# Patient Record
Sex: Female | Born: 1990 | Race: Black or African American | Hispanic: No | Marital: Single | Smoking: Former smoker
Health system: Southern US, Community
[De-identification: ages and names within clinical notes are randomized; demographics above are authoritative.]

## PROBLEM LIST (undated history)

## (undated) DIAGNOSIS — A599 Trichomoniasis, unspecified: Secondary | ICD-10-CM

## (undated) DIAGNOSIS — Z5189 Encounter for other specified aftercare: Secondary | ICD-10-CM

## (undated) DIAGNOSIS — T7840XA Allergy, unspecified, initial encounter: Secondary | ICD-10-CM

## (undated) DIAGNOSIS — D649 Anemia, unspecified: Secondary | ICD-10-CM

## (undated) HISTORY — DX: Encounter for other specified aftercare: Z51.89

## (undated) HISTORY — DX: Anemia, unspecified: D64.9

## (undated) HISTORY — DX: Trichomoniasis, unspecified: A59.9

## (undated) HISTORY — PX: TONSILLECTOMY: SUR1361

## (undated) HISTORY — PX: ECTOPIC PREGNANCY SURGERY: SHX613

## (undated) HISTORY — DX: Allergy, unspecified, initial encounter: T78.40XA

---

## 2012-04-30 ENCOUNTER — Encounter: Payer: Self-pay | Admitting: Physician Assistant

## 2012-04-30 ENCOUNTER — Ambulatory Visit (INDEPENDENT_AMBULATORY_CARE_PROVIDER_SITE_OTHER): Payer: BC Managed Care – PPO | Admitting: Physician Assistant

## 2012-04-30 VITALS — BP 142/63 | HR 60 | Temp 98.3°F | Resp 16 | Ht 64.5 in | Wt 215.0 lb

## 2012-04-30 DIAGNOSIS — M79609 Pain in unspecified limb: Secondary | ICD-10-CM

## 2012-04-30 DIAGNOSIS — M79644 Pain in right finger(s): Secondary | ICD-10-CM

## 2012-04-30 NOTE — Progress Notes (Signed)
  Subjective:    Patient ID: Madison Charles, female    DOB: 05/07/1991, 21 y.o.   MRN: [id number]  HPI Pt presents to clinic with a ring stuck on her R 4th digit.  She has tried everything she can think of and she cannot get it off.     Review of Systems     Objective:   Physical Exam  Vitals reviewed. Constitutional: She is oriented to person, place, and time. She appears well-developed and well-nourished.  HENT:  Head: Normocephalic and atraumatic.  Right Ear: External ear normal.  Left Ear: External ear normal.  Pulmonary/Chest: Effort normal.  Musculoskeletal:       Hands: Neurological: She is alert and oriented to person, place, and time.  Skin: Skin is warm and dry.  Psychiatric: She has a normal mood and affect. Her behavior is normal. Judgment and thought content normal.      Assessment & Plan:   1. Finger pain, right    Ring removed and patient states pain resolved.

## 2012-05-09 ENCOUNTER — Ambulatory Visit (INDEPENDENT_AMBULATORY_CARE_PROVIDER_SITE_OTHER): Payer: BC Managed Care – PPO | Admitting: Family Medicine

## 2012-05-09 ENCOUNTER — Ambulatory Visit: Payer: BC Managed Care – PPO

## 2012-05-09 VITALS — BP 124/81 | HR 61 | Temp 98.0°F | Resp 16 | Ht 64.0 in | Wt 211.0 lb

## 2012-05-09 DIAGNOSIS — M79609 Pain in unspecified limb: Secondary | ICD-10-CM

## 2012-05-09 DIAGNOSIS — M79673 Pain in unspecified foot: Secondary | ICD-10-CM

## 2012-05-09 DIAGNOSIS — M7740 Metatarsalgia, unspecified foot: Secondary | ICD-10-CM

## 2012-05-09 NOTE — Progress Notes (Signed)
  Subjective:    Patient ID: Madison Charles, female    DOB: 15-Oct-1990, 21 y.o.   MRN: [id number]  HPI R great toe pain x 2 days.  Pt works on her feet all day.  Has noticed pain on plantar aspect of R 1st metarsal.  Pain is worse with prolonged standing.  No numbness or paresthesias.     Review of Systems     Objective:   Physical Exam Gen: in bed, NAD HEENT: NCAT, EOMI CV: RRR PULM: normal breath sounds  MSK:  + TTP on plantar aspect of 1st metatarsal.  Full ROM and strength.  Neurovascularly intact distally.   [facility] reading (PRIMARY) by  Dr. Alvester Morin . R foot xray preliminarily negative for any fractures.        Assessment & Plan:  Metatarsalgia:  Will place metatarsal pad and post op shoe  RICE and NSAIDs.  Follow up in 1-2 weeks if sxs not improved.

## 2012-08-11 ENCOUNTER — Ambulatory Visit (INDEPENDENT_AMBULATORY_CARE_PROVIDER_SITE_OTHER): Payer: BC Managed Care – PPO | Admitting: Physician Assistant

## 2012-08-11 VITALS — BP 122/78 | HR 80 | Temp 98.6°F | Resp 12 | Ht 63.3 in | Wt 210.0 lb

## 2012-08-11 DIAGNOSIS — R509 Fever, unspecified: Secondary | ICD-10-CM

## 2012-08-11 DIAGNOSIS — R52 Pain, unspecified: Secondary | ICD-10-CM

## 2012-08-11 DIAGNOSIS — J029 Acute pharyngitis, unspecified: Secondary | ICD-10-CM

## 2012-08-11 MED ORDER — PSEUDOEPHEDRINE HCL ER 120 MG PO TB12: 120.0000 mg | ORAL_TABLET | Freq: Two times a day (BID) | ORAL | Status: DC

## 2012-08-11 MED ORDER — OSELTAMIVIR PHOSPHATE 75 MG PO CAPS: 75.0000 mg | ORAL_CAPSULE | Freq: Two times a day (BID) | ORAL | Status: DC

## 2012-08-11 NOTE — Progress Notes (Signed)
  Subjective:    Patient ID: Madison Charles, female    DOB: Dec 09, 1990, 21 y.o.   MRN: [id number]  HPI  21 yr old AAF presents with acute and sudden onset of illness about 36 hours ago.  She describes severe chills with having to put on multiple layers of clothing on and still couldn't get warm. She has a cough, sore throat, severe nasal congestion. R ear pain, both ears congested. Her body is aching all over. She did not have a flu shot.  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  Review of Systems  All other systems reviewed and are negative.       Objective:   Physical Exam  Nursing note and vitals reviewed. Constitutional: She is oriented to person, place, and time. She appears well-developed and well-nourished.       Appears ill, but not toxic  HENT:  Head: Normocephalic and atraumatic.  Right Ear: External ear normal.  Left Ear: External ear normal.  Mouth/Throat: Oropharynx is clear and moist. No oropharyngeal exudate (throat with mild erythema and PND).       B TM with congestion but no infection.  Nose with clear rhinorrhea  Neck: Normal range of motion. Neck supple.  Cardiovascular: Normal rate, regular rhythm and normal heart sounds.   Pulmonary/Chest: Effort normal and breath sounds normal.  Neurological: She is alert and oriented to person, place, and time.  Skin: Skin is warm and dry.  Psychiatric: She has a normal mood and affect. Her behavior is normal.          Assessment & Plan:  Clinically suspicious of flu-start tamiflu, fluids, rest, OTCs as needed for symptoms.

## 2012-10-25 ENCOUNTER — Ambulatory Visit: Payer: BC Managed Care – PPO | Admitting: Family Medicine

## 2012-11-02 ENCOUNTER — Ambulatory Visit (INDEPENDENT_AMBULATORY_CARE_PROVIDER_SITE_OTHER): Payer: BC Managed Care – PPO | Admitting: Physician Assistant

## 2012-11-02 VITALS — BP 136/84 | HR 62 | Temp 98.2°F | Resp 18 | Ht 64.5 in | Wt 211.0 lb

## 2012-11-02 DIAGNOSIS — N390 Urinary tract infection, site not specified: Secondary | ICD-10-CM

## 2012-11-02 DIAGNOSIS — R350 Frequency of micturition: Secondary | ICD-10-CM

## 2012-11-02 LAB — POCT URINALYSIS DIPSTICK
Bilirubin, UA: NEGATIVE
Glucose, UA: NEGATIVE
Ketones, UA: NEGATIVE
Leukocytes, UA: NEGATIVE
Nitrite, UA: NEGATIVE
Protein, UA: 30
Spec Grav, UA: 1.025
Urobilinogen, UA: 0.2
pH, UA: 6

## 2012-11-02 LAB — POCT UA - MICROSCOPIC ONLY
Casts, Ur, LPF, POC: NEGATIVE
Crystals, Ur, HPF, POC: NEGATIVE
Mucus, UA: POSITIVE
Yeast, UA: NEGATIVE

## 2012-11-02 MED ORDER — SULFAMETHOXAZOLE-TRIMETHOPRIM 800-160 MG PO TABS: 1.0000 | ORAL_TABLET | Freq: Two times a day (BID) | ORAL | Status: DC

## 2012-11-02 NOTE — Progress Notes (Signed)
I have examined this patient along with the student and agree. She will establish with primary care for routine care and screening. Fernande Bras, PA-C Certified Physician Assistant [facility]

## 2012-11-02 NOTE — Progress Notes (Signed)
Subjective:    Patient ID: Madison Charles, female    DOB: 01/22/1991, 22 y.o.   MRN: [id number]  HPI  A 22 year old female presents with urinary frequency since 10/26/12.  Pt complains of urinary urgency and frequency since 10/26/12.  Admits to hematuria but states she is currently on her menstrual cycle.  Also admits to LBP and some abdominal pain.  Denies vaginal odor, pruritis, dysuria, fever.  Pt has tendency to hold her urine for long periods of time.        Pt is in a monogamous, heterosexual relationship.  She last had vaginal intercourse on 10/26/12 and her Nuvaring fell out spontaneously on 10/27/12.  Has hx of trichomonas 2 years ago that was tx and resolved.  Pt does not engage in oral intercourse.  Last papsmear was about 2-3 weeks ago and was normal.  She also had STD testing at this time which was negative.  Pt admits to polydipsia since 10/26/12.  She has a significant family history of diabetes and has not had a CPE in 3 years.   Past Medical History  Diagnosis Date  . Trichomonas 2 yrs ago    tx and resolved    Past Surgical History  Procedure Laterality Date  . Tonsillectomy Bilateral around 8 or 22 y.o.    Prior to Admission medications   Medication Sig Start Date End Date Taking? Authorizing [name]  etonogestrel-ethinyl estradiol (NUVARING) 0.12-0.015 MG/24HR vaginal ring Place 1 each vaginally every 28 (twenty-eight) days. Insert vaginally and leave in place for 3 consecutive weeks, then remove for 1 week.   Yes Historical [name], MD    No Known Allergies  History   Social History  . Marital Status: Single    Spouse Name: N/A    Number of Children: N/A  . Years of Education: N/A   Occupational History  . Forensic psychologist and [employer]  . cashier Other    [employer]   Social History Main Topics  . Smoking status: Never Smoker   . Smokeless tobacco: Not on file  . Alcohol Use: No  . Drug Use: No  . Sexually Active: Yes   Birth Control/ Protection: Condom, Other-see comments     Comment: Nuvaring   Other Topics Concern  . Not on file   Social History Narrative  . No narrative on file    Family History  Problem Relation Age of Onset  . Diabetes Father   . Heart disease Father   . Diabetes Sister   . Cancer Maternal Grandmother     colon  . Heart disease Maternal Grandfather     MI  . Cancer Maternal Aunt     thyroid    Review of Systems   As above.  Objective:   Physical Exam  BP 136/84  Pulse 62  Temp(Src) 98.2 F (36.8 C) (Oral)  Resp 18  Ht 5' 4.5" (1.638 m)  Wt 211 lb (95.709 kg)  BMI 35.67 kg/m2  SpO2 99%  LMP 10/29/2012  General:  Pleasant, obese female.  NAD. Neck:  Supple.  Positive acanthosis nigricans.  No lymphadenopathy. Abdomen:  Positive striae.  Obese, soft.  Suprapubic tenderness.  Normal bowel sounds.  Negative for CVA tenderness. Heart:  RRR.  Normal S1,S2.  No m/g/r. Lungs:  CTAB.    Assessment & Plan:  Urinary frequency - Plan: POCT UA - Microscopic Only, POCT urinalysis dipstick, Urine culture, sulfamethoxazole-trimethoprim (BACTRIM DS,SEPTRA DS) 800-160 MG per  tablet  UTI (urinary tract infection)  Patient Instructions  Please schedule an appointment with a primary care [name], here or elsewhere, for routine health maintenance.  Complete antibiotic until no pills left.Use condoms during every sexual encounter.  We will call you once your urine culture results come back.  If you finish your antibiotic and you are still having symptoms return to clinic for re-evaluation.

## 2012-11-02 NOTE — Patient Instructions (Addendum)
Please schedule an appointment with a primary care provider, here or elsewhere, for routine health maintenance.  Complete antibiotic until no pills left.Use condoms during every sexual encounter.  We will call you once your urine culture results come back.  If you finish your antibiotic and you are still having symptoms return to clinic for re-evaluation.

## 2012-11-04 LAB — URINE CULTURE

## 2012-11-05 ENCOUNTER — Encounter: Payer: Self-pay | Admitting: Physician Assistant

## 2012-11-16 ENCOUNTER — Ambulatory Visit (INDEPENDENT_AMBULATORY_CARE_PROVIDER_SITE_OTHER): Payer: BC Managed Care – PPO | Admitting: Family Medicine

## 2012-11-16 ENCOUNTER — Encounter: Payer: Self-pay | Admitting: Family Medicine

## 2012-11-16 VITALS — BP 116/70 | HR 53 | Temp 97.9°F | Resp 16 | Ht 64.0 in | Wt 209.0 lb

## 2012-11-16 DIAGNOSIS — Z0000 Encounter for general adult medical examination without abnormal findings: Secondary | ICD-10-CM

## 2012-11-16 DIAGNOSIS — J309 Allergic rhinitis, unspecified: Secondary | ICD-10-CM

## 2012-11-16 DIAGNOSIS — Z789 Other specified health status: Secondary | ICD-10-CM | POA: Insufficient documentation

## 2012-11-16 DIAGNOSIS — K3189 Other diseases of stomach and duodenum: Secondary | ICD-10-CM

## 2012-11-16 DIAGNOSIS — R1013 Epigastric pain: Secondary | ICD-10-CM | POA: Insufficient documentation

## 2012-11-16 LAB — POCT URINALYSIS DIPSTICK
Glucose, UA: NEGATIVE
Nitrite, UA: NEGATIVE
Urobilinogen, UA: 0.2

## 2012-11-16 MED ORDER — FLUTICASONE PROPIONATE 50 MCG/ACT NA SUSP: 2.0000 | Freq: Every day | NASAL | Status: DC

## 2012-11-16 MED ORDER — CETIRIZINE HCL 10 MG PO TABS: ORAL_TABLET | ORAL | Status: DC

## 2012-11-16 NOTE — Patient Instructions (Addendum)
Keeping You Healthy  Get These Tests 1. Blood Pressure- Have your blood pressure checked once a year by your health care provider.  Normal blood pressure is 120/80. 2. Weight- Have your body mass index (BMI) calculated to screen for obesity.  BMI is measure of body fat based on height and weight.  You can also calculate your own BMI at https://www.west-esparza.com/. 3. Cholesterol- Have your cholesterol checked every 5 years starting at age 20 then yearly starting at age 77. 4. Chlamydia, HIV, and other sexually transmitted diseases- Get screened every year until age 67, then within three months of each new sexual provider. 5. Pap Smear- Every 1-3 years; discuss with your health care provider. 6. Mammogram- Every year starting at age 20  Take these medicines  Calcium with Vitamin D-Your body needs 1200 mg of Calcium each day and [phone] IU of Vitamin D daily.  Your body can only absorb 500 mg of Calcium at a time so Calcium must be taken in 2 or 3 divided doses throughout the day.  Multivitamin with folic acid- Once daily if it is possible for you to become pregnant.  Get these Immunizations  Gardasil-Series of three doses; prevents HPV related illness such as genital warts and cervical cancer.  Menactra-Single dose; prevents meningitis.  Tetanus shot- Every 10 years.  Flu shot-Every year.  Take these steps 1. Do not smoke-Your healthcare provider can help you quit.  For tips on how to quit go to www.smokefree.gov or call 1-800 QUITNOW. 2. Be physically active- Exercise 5 days a week for at least 30 minutes.  If you are not already physically active, start slow and gradually work up to 30 minutes of moderate physical activity.  Examples of moderate activity include walking briskly, dancing, swimming, bicycling, etc. 3. Breast Cancer- A self breast exam every month is important for early detection of breast cancer.  For more information and instruction on self breast exams, ask your  healthcare provider or [place].[employer]. 4. Eat a healthy diet- Eat a variety of healthy foods such as fruits, vegetables, whole grains, low fat milk, low fat cheeses, yogurt, lean meats, poultry and fish, beans, nuts, tofu, etc.  For more information go to www. Thenutritionsource.org 5. Drink alcohol in moderation- Limit alcohol intake to one drink or less per day. Never drink and drive. 6. Depression- Your emotional health is as important as your physical health.  If you're feeling down or losing interest in things you normally enjoy please talk to your healthcare provider about being screened for depression. 7. Dental visit- Brush and floss your teeth twice daily; visit your dentist twice a year. 8. Eye doctor- Get an eye exam at least every 2 years. 9. Helmet use- Always wear a helmet when riding a bicycle, motorcycle, rollerblading or skateboarding. 10. Safe sex- If you may be exposed to sexually transmitted infections, use a condom. 11. Seat belts- Seat belts can save your live; always wear one. 12. Smoke/Carbon Monoxide detectors- These detectors need to be installed on the appropriate level of your home. Replace batteries at least once a year. 13. Skin cancer- When out in the sun please cover up and use sunscreen 15 SPF or higher. 14. Violence- If anyone is threatening or hurting you, please tell your healthcare provider.     Allergic Rhinitis Allergic rhinitis is when the mucous membranes in the nose respond to allergens. Allergens are particles in the air that cause your body to have an allergic reaction. This causes you to release allergic  antibodies. Through a chain of events, these eventually cause you to release histamine into the blood stream (hence the use of antihistamines). Although meant to be protective to the body, it is this release that causes your discomfort, such as frequent sneezing, congestion and an itchy runny nose.  CAUSES  The pollen  allergens may come from grasses, trees, and weeds. This is seasonal allergic rhinitis, or "hay fever." Other allergens cause year-round allergic rhinitis (perennial allergic rhinitis) such as house dust mite allergen, pet dander and mold spores.  SYMPTOMS   Nasal stuffiness (congestion).  Runny, itchy nose with sneezing and tearing of the eyes.  There is often an itching of the mouth, eyes and ears. It cannot be cured, but it can be controlled with medications. DIAGNOSIS  If you are unable to determine the offending allergen, skin or blood testing may find it. TREATMENT   Avoid the allergen.  Medications and allergy shots (immunotherapy) can help.  Hay fever may often be treated with antihistamines in pill or nasal spray forms. Antihistamines block the effects of histamine. There are over-the-counter medicines that may help with nasal congestion and swelling around the eyes. Check with your caregiver before taking or giving this medicine. If the treatment above does not work, there are many new medications your caregiver can prescribe. Stronger medications may be used if initial measures are ineffective. Desensitizing injections can be used if medications and avoidance fails. Desensitization is when a patient is given ongoing shots until the body becomes less sensitive to the allergen. Make sure you follow up with your caregiver if problems continue. SEEK MEDICAL CARE IF:   You develop fever (more than 100.5 F (38.1 C).  You develop a cough that does not stop easily (persistent).  You have shortness of breath.  You start wheezing.  Symptoms interfere with normal daily activities. Document Released: 05/03/2001 Document Revised: 10/31/2011 Document Reviewed: 11/12/2008 [facility] Patient Information 2013 [place], [place].     Indigestion Indigestion is discomfort in the upper abdomen that is caused by underlying problems such as gastroesophageal reflux disease (GERD), ulcers, or  gallbladder problems.  CAUSES  Indigestion can be caused by many things. Possible causes include:  Stomach acid in the esophagus.  Stomach infections, usually caused by the bacteria H. pylori.  Being overweight.  Hiatal hernia. This means part of the stomach pushes up through the diaphragm.  Overeating.  Emotional problems, such as stress, anxiety, or depression.  Poor nutrition.  Consuming too much alcohol, tobacco, or caffeine.  Consuming spicy foods, fats, peppermint, chocolate, tomato products, citrus, or fruit juices.  Medicines such as aspirin and other anti-inflammatory drugs, hormones, steroids, and thyroid medicines.  Gastroparesis. This is a condition in which the stomach does not empty properly.  Stomach cancer.  Pregnancy, due to an increase in hormone levels, a relaxation of muscles in the digestive tract, and pressure on the stomach from the growing fetus. SYMPTOMS   Uncomfortable feeling of fullness after eating.  Pain or burning sensation in the upper abdomen.  Bloating.  Belching and gas.  Nausea and vomiting.  Acidic taste in the mouth.  Burning sensation in the chest (heartburn). DIAGNOSIS  Your caregiver will review your medical history and perform a physical exam. Other tests, such as blood tests, stool tests, X-rays, and other imaging scans, may be done to check for more serious problems. TREATMENT  Liquid antacids and other drugs may be given to block stomach acid secretion. Medicines that increase esophageal muscle tone may also be  given to help reduce symptoms. If an infection is found, antibiotic medicine may be given. HOME CARE INSTRUCTIONS  Avoid foods and drinks that make your symptoms worse, such as:  Caffeine or alcoholic drinks.  Chocolate.  Peppermint or mint flavorings.  Garlic and onions.  Spicy foods.  Citrus fruits, such as oranges, lemons, or limes.  Tomato-based foods such as sauce, chili, salsa, and  pizza.  Fried and fatty foods.  Avoid eating for the 3 hours prior to your bedtime.  Eat small, frequent meals instead of large meals.  Stop smoking if you smoke.  Maintain a healthy weight.  Wear loose-fitting clothing. Do not wear anything tight around your waist that causes pressure on your stomach.  Raise the head of your bed 4 to 8 inches with wood blocks to help you sleep. Extra pillows will not help.  Only take over-the-counter or prescription medicines as directed by your caregiver.  Do not take aspirin, ibuprofen, or other nonsteroidal anti-inflammatory drugs (NSAIDs). SEEK IMMEDIATE MEDICAL CARE IF:   You are not better after 2 days.  You have chest pressure or pain that radiates up into your neck, arms, back, jaw, or upper abdomen.  You have difficulty swallowing.  You keep vomiting.  You have black or bloody stools.  You have a fever.  You have dizziness, fainting, difficulty breathing, or heavy sweating.  You have severe abdominal pain.  You lose weight without trying. MAKE SURE YOU:  Understand these instructions.  Will watch your condition.  Will get help right away if you are not doing well or get worse. Document Released: 09/15/2004 Document Revised: 10/31/2011 Document Reviewed: 03/23/2011 [facility] Patient Information 2013 [place], [place].

## 2012-11-16 NOTE — Progress Notes (Signed)
Subjective:    Patient ID: Madison Charles, female    DOB: April 09, 1991, 22 y.o.   MRN: [id number]  HPI  This 22 y.o. AA female is here for CPE; GYN care is at [facility] (Dr. Ernestina Penna).  Pt has results of Feb 2014 exam (to be scanned into EMR). HIV, RPR and Hep C Ab screening   (negative). Hgb A1c= 4.9%. Pt is working on weight reduction and healthier lifestyle.   Pt has allergies since childhood but is currently not taking medication. In the past, she has used   oral medication as well as nasal spray;  OTC meds have not been tried. She tries to control  dust in her home environment.   Pt reports occasional LUQ discomfort and bloating associated w/ spicy foods/ pasta. She denies  other food intolerances, n/v or change in bowel habits. Treatment for UTI at 89 earlier this month   is complete; she is asymptomatic.     Review of Systems  Constitutional: Negative.   HENT: Positive for congestion and sneezing.   Eyes: Negative.   Respiratory: Negative.   Cardiovascular: Negative.   Gastrointestinal: Negative.   Endocrine: Negative.   Genitourinary: Positive for urgency and frequency. Negative for dysuria and hematuria.  Musculoskeletal: Negative.   Skin: Negative.   Allergic/Immunologic: Negative.   Neurological: Negative.   Hematological: Negative.   Psychiatric/Behavioral: Negative.        Objective:   Physical Exam  Nursing note and vitals reviewed. Constitutional: She is oriented to person, place, and time. Vital signs are normal. She appears well-developed and well-nourished. No distress.  HENT:  Head: Normocephalic and atraumatic.  Right Ear: Hearing, tympanic membrane, external ear and ear canal normal.  Left Ear: Hearing, tympanic membrane, external ear and ear canal normal.  Nose: Mucosal edema present. No rhinorrhea, sinus tenderness, nasal deformity or septal deviation. Right sinus exhibits no maxillary sinus tenderness and no frontal sinus tenderness. Left  sinus exhibits no maxillary sinus tenderness and no frontal sinus tenderness.  Mouth/Throat: Uvula is midline and mucous membranes are normal. No oral lesions. Normal dentition. No dental caries. Posterior oropharyngeal erythema present. No oropharyngeal exudate or posterior oropharyngeal edema.  Nasal crease noted (due to "allergic salute")  Eyes: EOM and lids are normal. Pupils are equal, round, and reactive to light. Right eye exhibits no discharge. Left eye exhibits no discharge. Right conjunctiva is injected. Left conjunctiva is injected. No scleral icterus.  Neck: Normal range of motion. Neck supple. No thyromegaly present.  Cardiovascular: Normal rate, regular rhythm, normal heart sounds and intact distal pulses.  Exam reveals no gallop and no friction rub.   No murmur heard. Pulmonary/Chest: Effort normal and breath sounds normal. No respiratory distress. She has no wheezes.  Abdominal: Soft. She exhibits no distension and no mass. Bowel sounds are decreased. There is no hepatosplenomegaly. There is no tenderness. There is no guarding and no CVA tenderness.  Musculoskeletal: Normal range of motion. She exhibits no edema and no tenderness.  Lymphadenopathy:    She has no cervical adenopathy.  Neurological: She is alert and oriented to person, place, and time. She has normal reflexes. No cranial nerve deficit. She exhibits normal muscle tone. Coordination normal.  Skin: Skin is warm and dry. No rash noted. No erythema.  Psychiatric: She has a normal mood and affect. Her behavior is normal. Judgment and thought content normal.    Results for orders placed in visit on 11/16/12  POCT URINALYSIS DIPSTICK      Result  Value Range   Color, UA yellow     Clarity, UA clear     Glucose, UA neg     Bilirubin, UA neg     Ketones, UA trace     Spec Grav, UA 1.015     Blood, UA neg     pH, UA 6.5     Protein, UA neg     Urobilinogen, UA 0.2     Nitrite, UA neg     Leukocytes, UA small (1+)           Assessment & Plan:  Routine general medical examination at a health care facility - Plan: POCT urinalysis dipstick  Allergic rhinitis- oral antihistamine and steroid nasal spray prescribed. Use air purifier in bedroom.  Dyspepsia- advised food avoidances and other recommendations given.  Meds ordered this encounter  Medications  . cetirizine (ZYRTEC) 10 MG tablet    Sig: Take 1 tablet by mouth every evening for allergies.    Dispense:  30 tablet    Refill:  5  . fluticasone (FLONASE) 50 MCG/ACT nasal spray    Sig: Place 2 sprays into the nose daily. Use at bedtime.    Dispense:  16 g    Refill:  6

## 2013-06-06 ENCOUNTER — Ambulatory Visit (INDEPENDENT_AMBULATORY_CARE_PROVIDER_SITE_OTHER): Payer: BC Managed Care – PPO | Admitting: Family Medicine

## 2013-06-06 ENCOUNTER — Ambulatory Visit: Payer: BC Managed Care – PPO

## 2013-06-06 VITALS — BP 128/54 | HR 64 | Temp 98.5°F | Resp 18 | Ht 63.75 in | Wt 210.2 lb

## 2013-06-06 DIAGNOSIS — J302 Other seasonal allergic rhinitis: Secondary | ICD-10-CM

## 2013-06-06 DIAGNOSIS — J45990 Exercise induced bronchospasm: Secondary | ICD-10-CM

## 2013-06-06 DIAGNOSIS — R0602 Shortness of breath: Secondary | ICD-10-CM

## 2013-06-06 DIAGNOSIS — J309 Allergic rhinitis, unspecified: Secondary | ICD-10-CM

## 2013-06-06 LAB — POCT CBC
Granulocyte percent: 36.8 %G — AB (ref 37–80)
HCT, POC: 37.4 % — AB (ref 37.7–47.9)
Hemoglobin: 11.3 g/dL — AB (ref 12.2–16.2)
Lymph, poc: 2.5 (ref 0.6–3.4)
MCH, POC: 26.6 pg — AB (ref 27–31.2)
MCHC: 30.2 g/dL — AB (ref 31.8–35.4)
MCV: 87.9 fL (ref 80–97)
MID (cbc): 0.4 (ref 0–0.9)
MPV: 8.6 fL (ref 0–99.8)
POC Granulocyte: 1.7 — AB (ref 2–6.9)
POC LYMPH PERCENT: 55.3 %L — AB (ref 10–50)
POC MID %: 7.9 % (ref 0–12)
Platelet Count, POC: 205 10*3/uL (ref 142–424)
RBC: 4.25 M/uL (ref 4.04–5.48)
RDW, POC: 13.7 %
WBC: 4.5 10*3/uL — AB (ref 4.6–10.2)

## 2013-06-06 MED ORDER — ALBUTEROL SULFATE HFA 108 (90 BASE) MCG/ACT IN AERS: 2.0000 | INHALATION_SPRAY | Freq: Four times a day (QID) | RESPIRATORY_TRACT | Status: DC | PRN

## 2013-06-06 NOTE — Progress Notes (Signed)
[facility]:  Office Visit  Chief Complaint:  Chief Complaint  Patient presents with  . Shortness of Breath    pt.around smokers, becomes hard to breath for over a year, worsening    HPI: Madison Charles is a 22 y.o. female who is here for SOb and wheezing around smokers, x 1 year.  She never had asthma as a child, No hospitalizations in the past for RAD or asthma sxs Has allergies, seasonal allergies, Was taking zyrtec but stopped taking it, helped some but di dnot continue because ran out No prior history of inhaler Has chest tightening when Around smokers, when it gets hot, when she is active, she climbs a lot of stairs at work and that makes her SOB  When she runs she is SOB but she does not exercise regular, no major weight gain per patient in the last 5 years Nonsmoker, denies eczema Denis CP, palpitations, [place] Cordts edema   Past Medical History  Diagnosis Date  . Trichomonas 2 yrs ago    tx and resolved  . Allergy    Past Surgical History  Procedure Laterality Date  . Tonsillectomy Bilateral around 8 or 22 y.o.   History   Social History  . Marital Status: Single    Spouse Name: N/A    Number of Children: N/A  . Years of Education: N/A   Occupational History  . Forensic psychologist and [employer]  . cashier Other    [employer]   Social History Main Topics  . Smoking status: Never Smoker   . Smokeless tobacco: None  . Alcohol Use: No  . Drug Use: No  . Sexual Activity: Yes    Birth Control/ Protection: Condom, Other-see comments     Comment: Nuvaring   Other Topics Concern  . None   Social History Narrative  . None   Family History  Problem Relation Age of Onset  . Diabetes Father   . Heart disease Father   . Diabetes Sister   . Cancer Maternal Grandmother     colon  . Heart disease Maternal Grandfather     MI  . Cancer Maternal Aunt     thyroid   No Known Allergies Prior to Admission medications   Not  on File     ROS: The patient denies fevers, chills, night sweats, unintentional weight loss, chest pain, palpitations, wheezing, dyspnea on exertion, nausea, vomiting, abdominal pain, dysuria, hematuria, melena, numbness, weakness, or tingling.   All other systems have been reviewed and were otherwise negative with the exception of those mentioned in the HPI and as above.    PHYSICAL EXAM: Filed Vitals:   06/06/13 1956  BP: 128/54  Pulse: 64  Temp: 98.5 F (36.9 C)  Resp: 18  SPO2  99% Filed Vitals:   06/06/13 1956  Height: 5' 3.75" (1.619 m)  Weight: 210 lb 3.2 oz (95.346 kg)   Body mass index is 36.38 kg/(m^2).  General: Alert, no acute distress, obese AA female, pleasant  HEENT:  Normocephalic, atraumatic, oropharynx patent. EOMI, PERRLA Cardiovascular:  Regular rate and rhythm, no rubs murmurs or gallops.  No Carotid bruits, radial pulse intact. No pedal edema.  Respiratory: Clear to auscultation bilaterally.  No wheezes, rales, or rhonchi.  No cyanosis, no use of accessory musculature GI: No organomegaly, abdomen is soft and non-tender, positive bowel sounds.  No masses. Skin: No rashes. Neurologic: Facial musculature symmetric. Psychiatric: Patient is appropriate throughout our interaction.  Lymphatic: No cervical lymphadenopathy Musculoskeletal: Gait intact.   LABS: Results for orders placed in visit on 06/06/13  POCT CBC      Result Value Range   WBC 4.5 (*) 4.6 - 10.2 K/uL   Lymph, poc 2.5  0.6 - 3.4   POC LYMPH PERCENT 55.3 (*) 10 - 50 %L   MID (cbc) 0.4  0 - 0.9   POC MID % 7.9  0 - 12 %M   POC Granulocyte 1.7 (*) 2 - 6.9   Granulocyte percent 36.8 (*) 37 - 80 %G   RBC 4.25  4.04 - 5.48 M/uL   Hemoglobin 11.3 (*) 12.2 - 16.2 g/dL   HCT, POC 16.1 (*) 09.6 - 47.9 %   MCV 87.9  80 - 97 fL   MCH, POC 26.6 (*) 27 - 31.2 pg   MCHC 30.2 (*) 31.8 - 35.4 g/dL   RDW, POC 04.5     Platelet Count, POC 205  142 - 424 K/uL   MPV 8.6  0 - 99.8 fL     EKG/XRAY:    Primary read interpreted by Dr. Conley Rolls at [facility]. No acute cardiopulmonary process   ASSESSMENT/PLAN: Encounter Diagnoses  Name Primary?  . SOB (shortness of breath) Yes  . Exercise induced bronchospasm   . Seasonal allergies    Spirometry normal  Anemia due to most likely heavy periods, she is asymptomatic Not on Nuva ring, not on birth control low risk for PE. Uses condoms Rx albuterol prn, advise if have to use it more than 2 times daily or more than 2 times a week then need to return to office Continue with antihistamine otc  If she is not feeling better then consider adding singulair but need to do a trial of at least 1 month consistently Gross sideeffects, risk and benefits, and alternatives of medications d/w patient. Patient is aware that all medications have potential sideeffects and we are unable to predict every sideeffect or drug-drug interaction that may occur.  Shaquanda Graves PHUONG, DO 06/10/2013 9:41 AM

## 2013-06-06 NOTE — Patient Instructions (Addendum)
Allergies, Generic  Allergies may happen from anything your body is sensitive to. This may be food, medicines, pollens, chemicals, and nearly anything around you in everyday life that produces allergens. An allergen is anything that causes an allergy producing substance. Heredity is often a factor in causing these problems. This means you may have some of the same allergies as your parents.  Food allergies happen in all age groups. Food allergies are some of the most severe and life threatening. Some common food allergies are cow's milk, seafood, eggs, nuts, wheat, and soybeans.  SYMPTOMS    Swelling around the mouth.   An itchy red rash or hives.   Vomiting or diarrhea.   Difficulty breathing.  SEVERE ALLERGIC REACTIONS ARE LIFE-THREATENING.  This reaction is called anaphylaxis. It can cause the mouth and throat to swell and cause difficulty with breathing and swallowing. In severe reactions only a trace amount of food (for example, peanut oil in a salad) may cause death within seconds.  Seasonal allergies occur in all age groups. These are seasonal because they usually occur during the same season every year. They may be a reaction to molds, grass pollens, or tree pollens. Other causes of problems are house dust mite allergens, pet dander, and mold spores. The symptoms often consist of nasal congestion, a runny itchy nose associated with sneezing, and tearing itchy eyes. There is often an associated itching of the mouth and ears. The problems happen when you come in contact with pollens and other allergens. Allergens are the particles in the air that the body reacts to with an allergic reaction. This causes you to release allergic antibodies. Through a chain of events, these eventually cause you to release histamine into the blood stream. Although it is meant to be protective to the body, it is this release that causes your discomfort. This is why you were given anti-histamines to feel better. If you are  unable to pinpoint the offending allergen, it may be determined by skin or blood testing. Allergies cannot be cured but can be controlled with medicine.  Hay fever is a collection of all or some of the seasonal allergy problems. It may often be treated with simple over-the-counter medicine such as diphenhydramine. Take medicine as directed. Do not drink alcohol or drive while taking this medicine. Check with your caregiver or package insert for child dosages.  If these medicines are not effective, there are many new medicines your caregiver can prescribe. Stronger medicine such as nasal spray, eye drops, and corticosteroids may be used if the first things you try do not work well. Other treatments such as immunotherapy or desensitizing injections can be used if all else fails. Follow up with your caregiver if problems continue. These seasonal allergies are usually not life threatening. They are generally more of a nuisance that can often be handled using medicine.  HOME CARE INSTRUCTIONS    If unsure what causes a reaction, keep a diary of foods eaten and symptoms that follow. Avoid foods that cause reactions.   If hives or rash are present:   Take medicine as directed.   You may use an over-the-counter antihistamine (diphenhydramine) for hives and itching as needed.   Apply cold compresses (cloths) to the skin or take baths in cool water. Avoid hot baths or showers. Heat will make a rash and itching worse.   If you are severely allergic:   Following a treatment for a severe reaction, hospitalization is often required for closer follow-up.     use an anaphylaxis kit.  If you have had a severe reaction, always carry your anaphylaxis kit or EpiPen with you. Use this medicine as directed by your caregiver if a severe reaction is occurring. Failure to do so could have a fatal outcome. SEEK  MEDICAL CARE IF:  You suspect a food allergy. Symptoms generally happen within 30 minutes of eating a food.  Your symptoms have not gone away within 2 days or are getting worse.  You develop new symptoms.  You want to retest yourself or your child with a food or drink you think causes an allergic reaction. Never do this if an anaphylactic reaction to that food or drink has happened before. Only do this under the care of a caregiver. SEEK IMMEDIATE MEDICAL CARE IF:   You have difficulty breathing, are wheezing, or have a tight feeling in your chest or throat.  You have a swollen mouth, or you have hives, swelling, or itching all over your body.  You have had a severe reaction that has responded to your anaphylaxis kit or an EpiPen. These reactions may return when the medicine has worn off. These reactions should be considered life threatening. MAKE SURE YOU:   Understand these instructions.  Will watch your condition.  Will get help right away if you are not doing well or get worse. Document Released: 11/01/2002 Document Revised: 10/31/2011 Document Reviewed: 04/07/2008 [facility] Patient Information 2014 [place], [place]. Shortness of Breath Shortness of breath means you have trouble breathing. Shortness of breath may indicate that you have a medical problem. You should seek immediate medical care for shortness of breath. CAUSES   Not enough oxygen in the air (as with high altitudes or a smoke-filled room).  Short-term (acute) lung disease, including:  Infections, such as pneumonia.  Fluid in the lungs, such as heart failure.  A blood clot in the lungs (pulmonary embolism).  Long-term (chronic) lung diseases.  Heart disease (heart attack, angina, heart failure, and others).  Low red blood cells (anemia).  Poor physical fitness. This can cause shortness of breath when you exercise.  Chest or back injuries or stiffness.  Being overweight.  Smoking.  Anxiety. This can  make you feel like you are not getting enough air. DIAGNOSIS  Serious medical problems can usually be found during your physical exam. Tests may also be done to determine why you are having shortness of breath. Tests may include:  Chest X-rays.  Lung function tests.  Blood tests.  Electrocardiography.  Exercise testing.  Echocardiography.  Imaging scans. Your caregiver may not be able to find a cause for your shortness of breath after your exam. In this case, it is important to have a follow-up exam with your caregiver as directed.  TREATMENT  Treatment for shortness of breath depends on the cause of your symptoms and can vary greatly. HOME CARE INSTRUCTIONS   Do not smoke. Smoking is a common cause of shortness of breath. If you smoke, ask for help to quit.  Avoid being around chemicals or things that may bother your breathing, such as paint fumes and dust.  Rest as needed. Slowly resume your usual activities.  If medicines were prescribed, take them as directed for the full length of time directed. This includes oxygen and any inhaled medicines.  Keep all follow-up appointments as directed by your caregiver. SEEK MEDICAL CARE IF:   Your condition does not improve in the time expected.  You have a hard time doing your normal activities even with rest.  You have any side effects or problems with the medicines prescribed.  You develop any new symptoms. SEEK IMMEDIATE MEDICAL CARE IF:   Your shortness of breath gets worse.  You feel lightheaded, faint, or develop a cough not controlled with medicines.  You start coughing up blood.  You have pain with breathing.  You have chest pain or pain in your arms, shoulders, or abdomen.  You have a fever.  You are unable to walk up stairs or exercise the way you normally do. MAKE SURE YOU:  Understand these instructions.  Will watch your condition.  Will get help right away if you are not doing well or get  worse. Document Released: 05/03/2001 Document Revised: 02/07/2012 Document Reviewed: 10/24/2011 [facility] Patient Information 2014 [place], [place].

## 2013-09-11 ENCOUNTER — Emergency Department (HOSPITAL_COMMUNITY)
Admission: EM | Admit: 2013-09-11 | Discharge: 2013-09-11 | Disposition: A | Discharge status: Home / Self Care | Payer: Self-pay | Attending: Emergency Medicine | Admitting: Emergency Medicine

## 2013-09-11 ENCOUNTER — Encounter (HOSPITAL_COMMUNITY): Payer: Self-pay | Admitting: Emergency Medicine

## 2013-09-11 ENCOUNTER — Emergency Department (HOSPITAL_COMMUNITY): Payer: Self-pay

## 2013-09-11 DIAGNOSIS — R110 Nausea: Secondary | ICD-10-CM | POA: Insufficient documentation

## 2013-09-11 DIAGNOSIS — R079 Chest pain, unspecified: Secondary | ICD-10-CM | POA: Insufficient documentation

## 2013-09-11 MED ORDER — IBUPROFEN 600 MG PO TABS: 600.0000 mg | ORAL_TABLET | Freq: Three times a day (TID) | ORAL | Status: DC | PRN

## 2013-09-11 MED ORDER — IBUPROFEN 200 MG PO TABS
600.0000 mg | ORAL_TABLET | Freq: Once | ORAL | Status: AC
  Administered 2013-09-11: 600 mg via ORAL
  Filled 2013-09-11: qty 3

## 2013-09-11 NOTE — ED Provider Notes (Signed)
CSN: [id number]     Arrival date & time 09/11/13  0137 History   First MD Initiated Contact with Patient 09/11/13 0532     Chief Complaint  Patient presents with  . Chest Pain   HPI Patient reports several weeks of intermittent anterior chest pain.  She says it feels like a tightness.  Sometimes is painful.  It does not seem to change or worsen with food or position.  She occasionally has some shortness of breath with it.  She has no shortness of breath or pain at this time at rest.  She states that earlier when she was at work was causing his discomfort.  She has nausea.  She denies vomiting.  No diarrhea.  No fevers or chills.  No productive cough.  No history of DVT or pulmonary embolism.  No recent long travel or surgery.  She does not smoke cigarettes.  Past Medical History  Diagnosis Date  . Trichomonas 2 yrs ago    tx and resolved  . Allergy    Past Surgical History  Procedure Laterality Date  . Tonsillectomy Bilateral around 8 or 23 y.o.   Family History  Problem Relation Age of Onset  . Diabetes Father   . Heart disease Father   . Diabetes Sister   . Cancer Maternal Grandmother     colon  . Heart disease Maternal Grandfather     MI  . Cancer Maternal Aunt     thyroid   History  Substance Use Topics  . Smoking status: Never Smoker   . Smokeless tobacco: Not on file  . Alcohol Use: No   OB History   Grav Para Term Preterm Abortions TAB SAB Ect Mult Living                 Review of Systems  All other systems reviewed and are negative.    Allergies  Review of patient's allergies indicates no known allergies.  Home Medications   Current Outpatient Rx  Name  Route  Sig  Dispense  Refill  . albuterol (PROVENTIL HFA;VENTOLIN HFA) 108 (90 BASE) MCG/ACT inhaler   Inhalation   Inhale 2 puffs into the lungs every 6 (six) hours as needed for wheezing or shortness of breath.         Marland Kitchen. ibuprofen (ADVIL,MOTRIN) 600 MG tablet   Oral   Take 1 tablet (600 mg total)  by mouth every 8 (eight) hours as needed.   15 tablet   0    BP 134/87  Pulse 73  Temp(Src) 97.8 F (36.6 C) (Oral)  Resp 16  Ht 5\' 4"  (1.626 m)  SpO2 99%  LMP 08/11/2013 Physical Exam  Nursing note and vitals reviewed. Constitutional: She is oriented to person, place, and time. She appears well-developed and well-nourished. No distress.  HENT:  Head: Normocephalic and atraumatic.  Eyes: EOM are normal.  Neck: Normal range of motion.  Cardiovascular: Normal rate, regular rhythm and normal heart sounds.   Pulmonary/Chest: Effort normal and breath sounds normal.  Abdominal: Soft. She exhibits no distension. There is no tenderness.  Musculoskeletal: Normal range of motion.  Neurological: She is alert and oriented to person, place, and time.  Skin: Skin is warm and dry.  Psychiatric: She has a normal mood and affect. Judgment normal.    ED Course  Procedures (including critical care time) Labs Review Labs Reviewed - No data to display Imaging Review Dg Chest 2 View  09/11/2013   CLINICAL DATA:  Intermittent chest  pain.  EXAM: CHEST  2 VIEW  COMPARISON:  Chest radiograph from 06/06/2013  FINDINGS: The lungs are well-aerated and clear. There is no evidence of focal opacification, pleural effusion or pneumothorax.  The heart is normal in size; the mediastinal contour is within normal limits. No acute osseous abnormalities are seen.  IMPRESSION: No acute cardiopulmonary process seen.   Electronically Signed   By: Roanna Raider M.D.   On: 09/11/2013 04:05    EKG Interpretation    Date/Time:  Wednesday September 11 2013 01:46:37 EST Ventricular Rate:  71 PR Interval:  137 QRS Duration: 79 QT Interval:  391 QTC Calculation: 425 R Axis:   62 Text Interpretation:  Sinus rhythm No old tracing to compare Confirmed by Ahrianna Siglin  MD, Leland Raver ([id number]) on 09/11/2013 5:40:57 AM            MDM   1. Chest pain    Nonspecific anterior chest pain.  Doubt pulmonary moles in.  Doubt ACS.   Chest x-ray and EKG normal.  Discharge home with PCP followup.  She understands to return to the ER for new or worsening symptoms    Lyanne Co, MD 09/11/13 [phone]

## 2013-09-11 NOTE — ED Notes (Signed)
Patient is alert and oriented x3.  She is complaining of mid sternal chest pain that has been going  On for 2 months.  Currently she rates her pain 7 of 10 with nausea.

## 2013-10-07 ENCOUNTER — Ambulatory Visit: Payer: Self-pay | Admitting: Physician Assistant

## 2013-10-07 VITALS — BP 110/68 | HR 64 | Temp 98.3°F | Resp 18 | Ht 64.5 in | Wt 198.0 lb

## 2013-10-07 DIAGNOSIS — R197 Diarrhea, unspecified: Secondary | ICD-10-CM

## 2013-10-07 LAB — POCT CBC
Granulocyte percent: 45.3 %G (ref 37–80)
HCT, POC: 46.1 % (ref 37.7–47.9)
HEMOGLOBIN: 14.2 g/dL (ref 12.2–16.2)
LYMPH, POC: 2.2 (ref 0.6–3.4)
MCH: 27.8 pg (ref 27–31.2)
MCHC: 30.8 g/dL — AB (ref 31.8–35.4)
MCV: 90.2 fL (ref 80–97)
MID (CBC): 0.5 (ref 0–0.9)
MPV: 8.2 fL (ref 0–99.8)
PLATELET COUNT, POC: 214 10*3/uL (ref 142–424)
POC Granulocyte: 2.2 (ref 2–6.9)
POC LYMPH PERCENT: 45.3 %L (ref 10–50)
POC MID %: 9.4 % (ref 0–12)
RBC: 5.11 M/uL (ref 4.04–5.48)
RDW, POC: 13.1 %
WBC: 4.9 10*3/uL (ref 4.6–10.2)

## 2013-10-07 NOTE — Patient Instructions (Signed)
Your blood count is normal today which is reassuring that you do not have a bacterial infection  The stool sample will tell is if there is a parasite (like giardia) causing this - if that is the case we will treat you with a medicine called metronidazole (Flagyl)  In the mean time, make sure you are staying hydrated (water is best!)  You can also make a mixture of half gatorade, half water.  Do not drink full strength gatorade as this has LOTS of sugar that can actually worsen your symptoms.  Eat what you can, but I would avoid dairy and greasy foods.  Take immodium 2-3 times per day to help slow things down  Wash your hands well to prevent spreading germs  If any symptoms are worsening or you develop new symptoms please let us know   Diarrhea Diarrhea is frequent loose and watery bowel movements. It can cause you to feel weak and dehydrated. Dehydration can cause you to become tired and thirsty, have a dry mouth, and have decreased urination that often is dark yellow. Diarrhea is a sign of another problem, most often an infection that will not last long. In most cases, diarrhea typically lasts 2 3 days. However, it can last longer if it is a sign of something more serious. It is important to treat your diarrhea as directed by your caregive to lessen or prevent future episodes of diarrhea. CAUSES  Some common causes include:  Gastrointestinal infections caused by viruses, bacteria, or parasites.  Food poisoning or food allergies.  Certain medicines, such as antibiotics, chemotherapy, and laxatives.  Artificial sweeteners and fructose.  Digestive disorders. HOME CARE INSTRUCTIONS  Ensure adequate fluid intake (hydration): have 1 cup (8 oz) of fluid for each diarrhea episode. Avoid fluids that contain simple sugars or sports drinks, fruit juices, whole milk products, and sodas. Your urine should be clear or pale yellow if you are drinking enough fluids. Hydrate with an oral rehydration  solution that you can purchase at pharmacies, retail stores, and online. You can prepare an oral rehydration solution at home by mixing the following ingredients together:    tsp table salt.   tsp baking soda.   tsp salt substitute containing potassium chloride.  1  tablespoons sugar.  1 L (34 oz) of water.  Certain foods and beverages may increase the speed at which food moves through the gastrointestinal (GI) tract. These foods and beverages should be avoided and include:  Caffeinated and alcoholic beverages.  High-fiber foods, such as raw fruits and vegetables, nuts, seeds, and whole grain breads and cereals.  Foods and beverages sweetened with sugar alcohols, such as xylitol, sorbitol, and mannitol.  Some foods may be well tolerated and may help thicken stool including:  Starchy foods, such as rice, toast, pasta, low-sugar cereal, oatmeal, grits, baked potatoes, crackers, and bagels.  Bananas.  Applesauce.  Add probiotic-rich foods to help increase healthy bacteria in the GI tract, such as yogurt and fermented milk products.  Wash your hands well after each diarrhea episode.  Only take over-the-counter or prescription medicines as directed by your caregiver.  Take a warm bath to relieve any burning or pain from frequent diarrhea episodes. SEEK IMMEDIATE MEDICAL CARE IF:   You are unable to keep fluids down.  You have persistent vomiting.  You have blood in your stool, or your stools are black and tarry.  You do not urinate in 6 8 hours, or there is only a small amount of very dark  urine.  You have abdominal pain that increases or localizes.  You have weakness, dizziness, confusion, or lightheadedness.  You have a severe headache.  Your diarrhea gets worse or does not get better.  You have a fever or persistent symptoms for more than 2 3 days.  You have a fever and your symptoms suddenly get worse. MAKE SURE YOU:   Understand these instructions.  Will  watch your condition.  Will get help right away if you are not doing well or get worse. Document Released: 07/29/2002 Document Revised: 07/25/2012 Document Reviewed: 04/15/2012 [facility] Patient Information 2014 [place], [place].

## 2013-10-07 NOTE — Progress Notes (Signed)
Subjective:    Patient ID: Trinidad Curetamara D Lehane, female    DOB: 1991/06/08, 23 y.o.   MRN: [id number]  HPI   Ms. Senaida OresRichardson is a very pleasant 23 yr old female here with concern for illness.  Reports 3 days of diarrhea.  Estimates 10-15 loose stools per day.  States that everything she eats or drinks comes right back out.  Some nausea but no vomiting.  Did have some foul tasting burps yesterday.  Thinks stool color has been normal.  No blood in the stool.  Some generalized abd pain.  No fever or chills.  No known sick contacts.  No recent travel.  No new foods.  Has taking some liquid immodium - unsure how much of this she has taken.     Review of Systems  Constitutional: Negative for fever and chills.  HENT: Negative.   Respiratory: Negative for cough, shortness of breath and wheezing.   Cardiovascular: Negative.   Gastrointestinal: Positive for nausea, abdominal pain (generalized) and diarrhea. Negative for vomiting and blood in stool.  Genitourinary: Negative.   Musculoskeletal: Negative.   Skin: Negative.        Objective:   Physical Exam  Vitals reviewed. Constitutional: She is oriented to person, place, and time. She appears well-developed and well-nourished. No distress.  HENT:  Head: Normocephalic and atraumatic.  Eyes: Conjunctivae are normal. No scleral icterus.  Cardiovascular: Normal rate, regular rhythm and normal heart sounds.   Pulmonary/Chest: Effort normal and breath sounds normal. She has no wheezes. She has no rales.  Abdominal: Soft. Bowel sounds are normal. There is no tenderness. There is no rebound and no guarding.  Neurological: She is alert and oriented to person, place, and time.  Skin: Skin is warm and dry.  Psychiatric: She has a normal mood and affect. Her behavior is normal.    Results for orders placed in visit on 10/07/13  POCT CBC      Result Value Ref Range   WBC 4.9  4.6 - 10.2 K/uL   Lymph, poc 2.2  0.6 - 3.4   POC LYMPH PERCENT 45.3  10 -  50 %L   MID (cbc) 0.5  0 - 0.9   POC MID % 9.4  0 - 12 %M   POC Granulocyte 2.2  2 - 6.9   Granulocyte percent 45.3  37 - 80 %G   RBC 5.11  4.04 - 5.48 M/uL   Hemoglobin 14.2  12.2 - 16.2 g/dL   HCT, POC 82.946.1  56.237.7 - 47.9 %   MCV 90.2  80 - 97 fL   MCH, POC 27.8  27 - 31.2 pg   MCHC 30.8 (*) 31.8 - 35.4 g/dL   RDW, POC 13.013.1     Platelet Count, POC 214  142 - 424 K/uL   MPV 8.2  0 - 99.8 fL       Assessment & Plan:  1. Diarrhea Ms. Senaida OresRichardson is a pleasant 23 yr old female here with 3 days of diarrhea.  She is afebrile, VSS, abd exam is benign.  CBC is normal.  O&P pending.  Likely viral gastroenteritis vs giardia.  Discussed empirically starting Flagyl to cover giardia.  Pt would prefer to wait on O&P before treating, which I think is very reasonable.  Will continue immodium 2-3 times per day to slow down diarrhea.  Stay hydrated.  Advance diet as tolerated.  Would avoid dairy for the time being.  Encouraged frequent hand washing to prevent spread of  disease.  If worsening or any new symptoms develop, pt to RTC. - POCT CBC - Ova and parasite examination   Pt to call or RTC if worsening or not improving  E. Frances Furbish MHS, PA-C [facility] 2/16/20152:49 PM

## 2014-03-01 ENCOUNTER — Telehealth: Payer: Self-pay | Admitting: *Deleted

## 2014-03-01 MED ORDER — ALBUTEROL SULFATE HFA 108 (90 BASE) MCG/ACT IN AERS: 2.0000 | INHALATION_SPRAY | Freq: Four times a day (QID) | RESPIRATORY_TRACT | Status: DC | PRN

## 2014-03-01 NOTE — Telephone Encounter (Signed)
Patient requesting refill on her inhaler

## 2014-03-01 NOTE — Telephone Encounter (Signed)
Inhaler sent to pharmacy.  Needs OV for further refills

## 2014-03-03 NOTE — Telephone Encounter (Signed)
Pt's sister Apolonio SchneidersSheketia notified.

## 2014-03-03 NOTE — Telephone Encounter (Signed)
Tried to call patient.  No phone number in chart.

## 2014-03-05 ENCOUNTER — Other Ambulatory Visit: Payer: Self-pay

## 2014-03-05 MED ORDER — ALBUTEROL SULFATE HFA 108 (90 BASE) MCG/ACT IN AERS: 2.0000 | INHALATION_SPRAY | Freq: Four times a day (QID) | RESPIRATORY_TRACT | Status: AC | PRN

## 2014-03-05 NOTE — Telephone Encounter (Signed)
Pt needed inhaler sent to a different pharm. Sent

## 2017-08-22 HISTORY — PX: ECTOPIC PREGNANCY SURGERY: SHX613

## 2018-05-31 DIAGNOSIS — D500 Iron deficiency anemia secondary to blood loss (chronic): Secondary | ICD-10-CM | POA: Insufficient documentation

## 2018-06-01 DIAGNOSIS — Z9079 Acquired absence of other genital organ(s): Secondary | ICD-10-CM | POA: Insufficient documentation

## 2019-09-18 LAB — HM PAP SMEAR

## 2020-02-11 ENCOUNTER — Emergency Department (HOSPITAL_BASED_OUTPATIENT_CLINIC_OR_DEPARTMENT_OTHER): Payer: Self-pay

## 2020-02-11 ENCOUNTER — Emergency Department (HOSPITAL_BASED_OUTPATIENT_CLINIC_OR_DEPARTMENT_OTHER)
Admission: EM | Admit: 2020-02-11 | Discharge: 2020-02-11 | Disposition: A | Discharge status: Home / Self Care | Payer: Self-pay | Attending: Emergency Medicine | Admitting: Emergency Medicine

## 2020-02-11 ENCOUNTER — Other Ambulatory Visit: Payer: Self-pay

## 2020-02-11 ENCOUNTER — Encounter (HOSPITAL_BASED_OUTPATIENT_CLINIC_OR_DEPARTMENT_OTHER): Payer: Self-pay

## 2020-02-11 DIAGNOSIS — Y939 Activity, unspecified: Secondary | ICD-10-CM | POA: Insufficient documentation

## 2020-02-11 DIAGNOSIS — S0240DA Maxillary fracture, left side, initial encounter for closed fracture: Secondary | ICD-10-CM | POA: Insufficient documentation

## 2020-02-11 DIAGNOSIS — Y999 Unspecified external cause status: Secondary | ICD-10-CM | POA: Insufficient documentation

## 2020-02-11 DIAGNOSIS — Z3202 Encounter for pregnancy test, result negative: Secondary | ICD-10-CM | POA: Insufficient documentation

## 2020-02-11 DIAGNOSIS — Y929 Unspecified place or not applicable: Secondary | ICD-10-CM | POA: Insufficient documentation

## 2020-02-11 DIAGNOSIS — S0083XA Contusion of other part of head, initial encounter: Secondary | ICD-10-CM | POA: Insufficient documentation

## 2020-02-11 LAB — PREGNANCY, URINE: Preg Test, Ur: NEGATIVE

## 2020-02-11 MED ORDER — AMOXICILLIN-POT CLAVULANATE 875-125 MG PO TABS: 1.0000 | ORAL_TABLET | Freq: Two times a day (BID) | ORAL | 0 refills | Status: DC
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
## 2020-02-11 NOTE — Discharge Instructions (Addendum)
Your work-up was overall reassuring, however your CT scan did show a possible old versus new fracture in the bone above your eye.  Please follow-up with the provided ear nose and throat doctor within the week for follow-up.  Treat pain with Tylenol as discussed.  We will also treat your bilateral hand swelling with antibiotics.  It is very important that you take this antibiotic to prevent worsening infection.  I have provided a coupon for [facility] for a discounted price.  Please return if you have any new or worsening symptoms.
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
## 2021-01-22 ENCOUNTER — Emergency Department (HOSPITAL_BASED_OUTPATIENT_CLINIC_OR_DEPARTMENT_OTHER)
Admission: EM | Admit: 2021-01-22 | Discharge: 2021-01-22 | Disposition: A | Discharge status: Home / Self Care | Payer: Self-pay | Attending: Emergency Medicine | Admitting: Emergency Medicine

## 2021-01-22 ENCOUNTER — Encounter (HOSPITAL_BASED_OUTPATIENT_CLINIC_OR_DEPARTMENT_OTHER): Payer: Self-pay

## 2021-01-22 ENCOUNTER — Other Ambulatory Visit: Payer: Self-pay

## 2021-01-22 DIAGNOSIS — Z20822 Contact with and (suspected) exposure to covid-19: Secondary | ICD-10-CM | POA: Insufficient documentation

## 2021-01-22 DIAGNOSIS — N760 Acute vaginitis: Secondary | ICD-10-CM | POA: Insufficient documentation

## 2021-01-22 DIAGNOSIS — B9689 Other specified bacterial agents as the cause of diseases classified elsewhere: Secondary | ICD-10-CM | POA: Insufficient documentation

## 2021-01-22 LAB — CBC WITH DIFFERENTIAL/PLATELET
Abs Immature Granulocytes: 0.01 10*3/uL (ref 0.00–0.07)
Basophils Absolute: 0 10*3/uL (ref 0.0–0.1)
Basophils Relative: 1 %
Eosinophils Absolute: 0.1 10*3/uL (ref 0.0–0.5)
Eosinophils Relative: 2 %
HCT: 42 % (ref 36.0–46.0)
Hemoglobin: 13.3 g/dL (ref 12.0–15.0)
Immature Granulocytes: 0 %
Lymphocytes Relative: 44 %
Lymphs Abs: 2.2 10*3/uL (ref 0.7–4.0)
MCH: 27.8 pg (ref 26.0–34.0)
MCHC: 31.7 g/dL (ref 30.0–36.0)
MCV: 87.7 fL (ref 80.0–100.0)
Monocytes Absolute: 0.5 10*3/uL (ref 0.1–1.0)
Monocytes Relative: 9 %
Neutro Abs: 2.2 10*3/uL (ref 1.7–7.7)
Neutrophils Relative %: 44 %
Platelets: 235 10*3/uL (ref 150–400)
RBC: 4.79 MIL/uL (ref 3.87–5.11)
RDW: 13.3 % (ref 11.5–15.5)
WBC: 5 10*3/uL (ref 4.0–10.5)
nRBC: 0 % (ref 0.0–0.2)

## 2021-01-22 LAB — URINALYSIS, ROUTINE W REFLEX MICROSCOPIC
Bilirubin Urine: NEGATIVE
Glucose, UA: NEGATIVE mg/dL
Hgb urine dipstick: NEGATIVE
Ketones, ur: NEGATIVE mg/dL
Leukocytes,Ua: NEGATIVE
Nitrite: NEGATIVE
Protein, ur: NEGATIVE mg/dL
Specific Gravity, Urine: >1.03 — ABNORMAL HIGH (ref 1.005–1.030)
pH: 6.5 (ref 5.0–8.0)

## 2021-01-22 LAB — COMPREHENSIVE METABOLIC PANEL
ALT: 18 U/L (ref 0–44)
AST: 24 U/L (ref 15–41)
Albumin: 3.7 g/dL (ref 3.5–5.0)
Alkaline Phosphatase: 55 U/L (ref 38–126)
Anion gap: 5 (ref 5–15)
BUN: 12 mg/dL (ref 6–20)
CO2: 25 mmol/L (ref 22–32)
Calcium: 8 mg/dL — ABNORMAL LOW (ref 8.9–10.3)
Chloride: 105 mmol/L (ref 98–111)
Creatinine, Ser: 0.86 mg/dL (ref 0.44–1.00)
GFR, Estimated: >60 mL/min (ref >60)
Glucose, Bld: 88 mg/dL (ref 70–99)
Potassium: 3.6 mmol/L (ref 3.5–5.1)
Sodium: 135 mmol/L (ref 135–145)
Total Bilirubin: 0.8 mg/dL (ref 0.3–1.2)
Total Protein: 6.6 g/dL (ref 6.5–8.1)

## 2021-01-22 LAB — RESP PANEL BY RT-PCR (FLU A&B, COVID) ARPGX2
Influenza A by PCR: NEGATIVE
Influenza B by PCR: NEGATIVE
SARS Coronavirus 2 by RT PCR: NEGATIVE

## 2021-01-22 LAB — WET PREP, GENITAL
Sperm: NONE SEEN
Trich, Wet Prep: NONE SEEN
WBC, Wet Prep HPF POC: NONE SEEN
Yeast Wet Prep HPF POC: NONE SEEN

## 2021-01-22 LAB — PREGNANCY, URINE: Preg Test, Ur: NEGATIVE

## 2021-01-22 LAB — LIPASE, BLOOD: Lipase: 30 U/L (ref 11–51)

## 2021-01-22 MED ORDER — METRONIDAZOLE 500 MG PO TABS: 500.0000 mg | ORAL_TABLET | Freq: Two times a day (BID) | ORAL | 0 refills | Status: AC

## 2021-01-22 MED ORDER — ONDANSETRON 4 MG PO TBDP
4.0000 mg | ORAL_TABLET | Freq: Once | ORAL | Status: AC
  Administered 2021-01-22: 4 mg via ORAL
  Filled 2021-01-22: qty 1

## 2021-01-22 MED ORDER — ONDANSETRON 4 MG PO TBDP: 4.0000 mg | ORAL_TABLET | Freq: Three times a day (TID) | ORAL | 0 refills | Status: DC | PRN

## 2021-01-22 NOTE — Discharge Instructions (Signed)
You are seen in the ER today for your nausea and vomiting as well as your vaginal discharge.  Your physical exam, vital signs, blood work were very reassuring.  You did test positive for bacterial vaginosis and prescribed an antibiotic called Flagyl to take twice a day for the next week.  Please complete the entire prescription as prescribed.  Additionally been given a medication called Zofran to take as needed for your nausea and vomiting.  Your test results for COVID-19, influenza A/B, as well as your other pending test results may be followed up in the MyChart app.  Should you test positive, please return the emergency department to undergo treatment.  Of note, you were found to have a low heart rate in the emergency department today.  While there is no dangerous change on your EKG, recommend you follow closely with a cardiologist for reevaluation.  Please see the cardiology information listed below.  Return to the ER if you develop any worsening nausea, vomiting that does not stop, sudden severe abdominal pain, chest pain, shortness of breath, or any other new severe symptoms.

## 2021-01-22 NOTE — ED Provider Notes (Signed)
[facility] EMERGENCY DEPARTMENT Provider Note   CSN: [id number] Arrival date & time: 01/22/21  1614     History Chief Complaint  Patient presents with  . Vaginal Discharge    Madison Charles is a 30 y.o. female who presents with persistent nausea, intermittent vomiting postprandially x5 days, now with thick white vaginal discharge.  She is in a monogamous relationship with a female partner, however does not use barrier protection consistently with history of STDs in the past that have been treated, has not any contraception.  She denies any new skin changes, vaginal pain, or new vaginal bleeding.  LMP was 11/23/2020.  She denies any fevers or chills at home, denies diarrhea or urinary symptoms.  I personally read patient's medical records.  She has history of trichomonas and allergies.  HPI     Past Medical History:  Diagnosis Date  . Allergy   . Trichomonas 2 yrs ago   tx and resolved    Patient Active Problem List   Diagnosis Date Noted  . Allergic rhinitis 11/16/2012  . Dyspepsia 11/16/2012  . Uses contraception 11/16/2012    Past Surgical History:  Procedure Laterality Date  . ECTOPIC PREGNANCY SURGERY    . TONSILLECTOMY Bilateral around 8 or 30 y.o.     OB History   No obstetric history on file.     Family History  Problem Relation Age of Onset  . Diabetes Father   . Heart disease Father   . Diabetes Sister   . Cancer Maternal Grandmother        colon  . Heart disease Maternal Grandfather        MI  . Cancer Maternal Aunt        thyroid    Social History   Tobacco Use  . Smoking status: Never Smoker  . Smokeless tobacco: Never Used  Vaping Use  . Vaping Use: Never used  Substance Use Topics  . Alcohol use: Yes    Comment: occ  . Drug use: No    Home Medications Prior to Admission medications   Medication Sig Start Date End Date Taking? Authorizing Provider  metroNIDAZOLE (FLAGYL) 500 MG tablet Take 1 tablet (500 mg total) by  mouth 2 (two) times daily for 7 days. 01/22/21 01/29/21 Yes Harleen Fineberg R, PA-C  ondansetron (ZOFRAN ODT) 4 MG disintegrating tablet Take 1 tablet (4 mg total) by mouth every 8 (eight) hours as needed for nausea or vomiting. 01/22/21  Yes Krystyna Cleckley R, PA-C  albuterol (PROVENTIL HFA;VENTOLIN HFA) 108 (90 BASE) MCG/ACT inhaler Inhale 2 puffs into the lungs every 6 (six) hours as needed for wheezing or shortness of breath. 03/05/14   Godfrey Pick, PA-C  ibuprofen (ADVIL,MOTRIN) 600 MG tablet Take 1 tablet (600 mg total) by mouth every 8 (eight) hours as needed. 09/11/13   Azalia Bilis, MD    Allergies    Patient has no known allergies.  Review of Systems   Review of Systems  Constitutional: Negative.   HENT: Negative.   Respiratory: Negative.   Cardiovascular: Negative.   Gastrointestinal: Positive for nausea and vomiting. Negative for abdominal pain, anal bleeding, blood in stool, constipation and diarrhea.  Genitourinary: Positive for vaginal discharge. Negative for decreased urine volume, difficulty urinating, dyspareunia, dysuria, enuresis, flank pain, frequency, genital sores, hematuria, menstrual problem, pelvic pain, urgency, vaginal bleeding and vaginal pain.  Musculoskeletal: Negative.   Skin: Negative.   Neurological: Negative.   Hematological: Negative.     Physical Exam  Updated Vital Signs BP 119/63   Pulse (!) 46   Temp 98.3 F (36.8 C) (Oral)   Resp (!) 23   Ht 5\' 5"  (1.651 m)   LMP 11/23/2020   SpO2 100%   BMI 37.94 kg/m   Physical Exam Vitals and nursing note reviewed. Exam conducted with a chaperone present (Female EMT as chaperone).  HENT:     Head: Normocephalic and atraumatic.     Nose: Nose normal.     Mouth/Throat:     Mouth: Mucous membranes are moist.     Pharynx: Oropharynx is clear. Uvula midline. No oropharyngeal exudate or posterior oropharyngeal erythema.  Eyes:     General: Lids are normal. Vision grossly intact.        Right  eye: No discharge.        Left eye: No discharge.     Extraocular Movements: Extraocular movements intact.     Conjunctiva/sclera: Conjunctivae normal.     Pupils: Pupils are equal, round, and reactive to light.  Neck:     Trachea: Trachea and phonation normal.  Cardiovascular:     Rate and Rhythm: Regular rhythm. Bradycardia present.     Pulses: Normal pulses.     Heart sounds: Normal heart sounds.     Comments: Heart rate in the high 30s at time of physical exam, regular rhythm, will obtain EKG. Pulmonary:     Effort: Pulmonary effort is normal. No tachypnea, bradypnea, accessory muscle usage or respiratory distress.     Breath sounds: Normal breath sounds. No wheezing or rales.  Chest:     Chest wall: No mass, lacerations, tenderness, crepitus or edema.  Abdominal:     General: Bowel sounds are normal. There is no distension.     Palpations: Abdomen is soft.     Tenderness: There is no abdominal tenderness. There is no right CVA tenderness, left CVA tenderness, guarding or rebound.  Genitourinary:    General: Normal vulva.     Exam position: Lithotomy position.     Vagina: Vaginal discharge present.     Cervix: Normal.     Uterus: Normal.      Adnexa: Right adnexa normal and left adnexa normal.     Comments: Thick white and clear discharge present in the vaginal vault, moderate amount. No adnexal fullness or tenderness to palpation, no evidence of friability or lesions on the cervix or vaginal walls, no CMT. Musculoskeletal:        General: No deformity.     Cervical back: Normal range of motion and neck supple.     Right lower leg: No edema.     Left lower leg: No edema.  Lymphadenopathy:     Cervical: No cervical adenopathy.  Skin:    General: Skin is warm and dry.     Capillary Refill: Capillary refill takes less than 2 seconds.  Neurological:     Mental Status: She is alert and oriented to person, place, and time. Mental status is at baseline.  Psychiatric:         Mood and Affect: Mood normal.     ED Results / Procedures / Treatments   Labs (all labs ordered are listed, but only abnormal results are displayed) Labs Reviewed  WET PREP, GENITAL - Abnormal; Notable for the following components:      Result Value   Clue Cells Wet Prep HPF POC PRESENT (*)    All other components within normal limits  URINALYSIS, ROUTINE W REFLEX MICROSCOPIC - Abnormal;  Notable for the following components:   APPearance CLOUDY (*)    Specific Gravity, Urine >1.030 (*)    All other components within normal limits  COMPREHENSIVE METABOLIC PANEL - Abnormal; Notable for the following components:   Calcium 8.0 (*)    All other components within normal limits  URINE CULTURE  RESP PANEL BY RT-PCR (FLU A&B, COVID) ARPGX2  CBC WITH DIFFERENTIAL/PLATELET  PREGNANCY, URINE  LIPASE, BLOOD  HIV ANTIBODY (ROUTINE TESTING W REFLEX)  RPR  GC/CHLAMYDIA PROBE AMP ([place]) NOT AT [facility]    EKG EKG Interpretation  Date/Time:  Friday January 22 2021 17:33:07 EDT Ventricular Rate:  47 PR Interval:  143 QRS Duration: 78 QT Interval:  463 QTC Calculation: 410 R Axis:   43 Text Interpretation: Sinus bradycardia Atrial premature complex Confirmed by Lockie Mola, Adam ([id number]) on 01/22/2021 7:02:04 PM   Radiology No results found.  Procedures Procedures   Medications Ordered in ED Medications  ondansetron (ZOFRAN-ODT) disintegrating tablet 4 mg (4 mg Oral Given 01/22/21 1805)    ED Course  I have reviewed the triage vital signs and the nursing notes.  Pertinent labs & imaging results that were available during my care of the patient were reviewed by me and considered in my medical decision making (see chart for details).    MDM Rules/Calculators/A&P                         30 year old female presents with concern for 5 days of nausea, vomiting, and vaginal discharge.  Differential diagnosis includes is not limited to acute viral gastroenteritis, UTI, pyelonephritis, STD,  gastritis, peptic ulcer disease, GERD, pregnancy.  Vital signs are normal on intake.  Cardiopulmonary exam is significant only for sinus bradycardia, abdominal exam is benign.  GU exam performed in the presence of a chaperone with clear to white thick vaginal discharge without any other skin changes.  CBC unremarkable, CMP unremarkable, lipase is normal.  UA unremarkable, wet mount significant for clue cells.  Gonorrhea/chlamydia, respiratory pathogen panel, RPR, and HIV tests are pending.  EKG revealed sinus bradycardia with atrial premature complexes.  No further work-up warranted in the ED as time, patient feeling much improved after administration of Zofran.  Will discharge with as needed Zofran prescription.  While the exact etiology of the patient's nausea and vomiting remains unclear, there is not appear to be an emergent etiology at this time.  Recommend follow-up closely with her PCP.  Patient does have bacterial vaginosis, will discharge with course of Flagyl.  Recommend close OB/GYN follow-up.  Patient to follow-up in her pending test results in the MyChart app.  Madison Charles voiced understanding of her medical evaluation and treatment plan.  Each of her questions was answered to her expressed satisfaction.  Return precautions are given.  Patient is well-appearing, stable, and appropriate for discharge at this time.  This chart was dictated using voice recognition software, Dragon. Despite the best efforts of this provider to proofread and correct errors, errors may still occur which can change documentation meaning.  Final Clinical Impression(s) / ED Diagnoses Final diagnoses:  None    Rx / DC Orders ED Discharge Orders         Ordered    metroNIDAZOLE (FLAGYL) 500 MG tablet  2 times daily        01/22/21 1936    ondansetron (ZOFRAN ODT) 4 MG disintegrating tablet  Every 8 hours PRN        01/22/21 1950  Paris Lore, PA-C 01/22/21 2019    Virgina Norfolk,  DO 01/22/21 2316

## 2021-01-22 NOTE — ED Triage Notes (Signed)
Pt c/o vaginal /dc, nausea x 5 days-NAD-steady gait

## 2021-01-23 LAB — RPR: RPR Ser Ql: NONREACTIVE

## 2021-01-23 LAB — HIV ANTIBODY (ROUTINE TESTING W REFLEX): HIV Screen 4th Generation wRfx: NONREACTIVE

## 2021-01-24 LAB — URINE CULTURE

## 2021-01-25 LAB — GC/CHLAMYDIA PROBE AMP (~~LOC~~) NOT AT ARMC
Chlamydia: NEGATIVE
Comment: NEGATIVE
Comment: NORMAL
Neisseria Gonorrhea: NEGATIVE
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
## 2022-08-09 ENCOUNTER — Encounter (HOSPITAL_BASED_OUTPATIENT_CLINIC_OR_DEPARTMENT_OTHER): Payer: Self-pay | Admitting: *Deleted

## 2022-08-09 ENCOUNTER — Emergency Department (HOSPITAL_BASED_OUTPATIENT_CLINIC_OR_DEPARTMENT_OTHER)
Admission: EM | Admit: 2022-08-09 | Discharge: 2022-08-09 | Disposition: A | Discharge status: Home / Self Care | Payer: Self-pay | Attending: Emergency Medicine | Admitting: Emergency Medicine

## 2022-08-09 ENCOUNTER — Other Ambulatory Visit: Payer: Self-pay

## 2022-08-09 DIAGNOSIS — N760 Acute vaginitis: Secondary | ICD-10-CM | POA: Insufficient documentation

## 2022-08-09 LAB — URINALYSIS, ROUTINE W REFLEX MICROSCOPIC
Bilirubin Urine: NEGATIVE
Glucose, UA: NEGATIVE mg/dL
Hgb urine dipstick: NEGATIVE
Ketones, ur: NEGATIVE mg/dL
Leukocytes,Ua: NEGATIVE
Nitrite: NEGATIVE
Protein, ur: NEGATIVE mg/dL
Specific Gravity, Urine: 1.02 (ref 1.005–1.030)
pH: 6 (ref 5.0–8.0)

## 2022-08-09 LAB — WET PREP, GENITAL
Sperm: NONE SEEN
Trich, Wet Prep: NONE SEEN
WBC, Wet Prep HPF POC: <10 (ref <10)
Yeast Wet Prep HPF POC: NONE SEEN

## 2022-08-09 LAB — PREGNANCY, URINE: Preg Test, Ur: NEGATIVE

## 2022-08-09 MED ORDER — METRONIDAZOLE 500 MG PO TABS: 500.0000 mg | ORAL_TABLET | Freq: Two times a day (BID) | ORAL | 0 refills | Status: DC

## 2022-08-09 NOTE — ED Triage Notes (Signed)
Pt would like to be checked for STD.  She states that she has unprotected sex with a new partner and has had vaginal itching.  No pain. Pt states that this has been going on for 2 days

## 2022-08-09 NOTE — Discharge Instructions (Signed)
Thank you for allowing to be part of your care today.  Your testing came back positive for bacterial vaginosis.  I have sent a prescription of metronidazole over to your pharmacy.  Please begin taking this today and avoid consuming alcohol while on this medication.  Please complete the entire 7-day course.  I recommend following up with your primary care physician if your symptoms do not improve.  Your other test results will be available via MyChart when they are completed.

## 2022-08-09 NOTE — ED Provider Notes (Signed)
[facility] EMERGENCY DEPARTMENT Provider Note   CSN: [id number] Arrival date & time: 08/09/22  1337     History  Chief Complaint  Patient presents with   SEXUALLY TRANSMITTED DISEASE    Madison Charles is a 31 y.o. female presents to the ED complaining of vaginal discomfort, vaginal itching, and increased frequency of urination.  Patient states that she had unprotected sex with a new partner and has been having vaginal itching for the past 2 days.  Denies vaginal bleeding, discharge, dysuria, abdominal pain, vaginal odor.  Patient states she has had BV in the past.       Home Medications Prior to Admission medications   Medication Sig Start Date End Date Taking? Authorizing Provider  metroNIDAZOLE (FLAGYL) 500 MG tablet Take 1 tablet (500 mg total) by mouth 2 (two) times daily. 08/09/22  Yes Thomasine Klutts R, PA  albuterol (PROVENTIL HFA;VENTOLIN HFA) 108 (90 BASE) MCG/ACT inhaler Inhale 2 puffs into the lungs every 6 (six) hours as needed for wheezing or shortness of breath. 03/05/14   Godfrey Pick, PA-C  ibuprofen (ADVIL,MOTRIN) 600 MG tablet Take 1 tablet (600 mg total) by mouth every 8 (eight) hours as needed. 09/11/13   Azalia Bilis, MD  ondansetron (ZOFRAN ODT) 4 MG disintegrating tablet Take 1 tablet (4 mg total) by mouth every 8 (eight) hours as needed for nausea or vomiting. 01/22/21   Sponseller, Eugene Gavia, PA-C      Allergies    Patient has no known allergies.    Review of Systems   Review of Systems  Genitourinary:  Positive for frequency. Negative for dysuria, flank pain, genital sores, hematuria, pelvic pain, urgency, vaginal bleeding and vaginal discharge.       Vaginal itching    Physical Exam Updated Vital Signs BP (!) 125/91   Pulse 80   Temp 98.1 F (36.7 C)   Resp 18   LMP 07/26/2022 (Approximate)   SpO2 100%  Physical Exam Vitals and nursing note reviewed.  Constitutional:      General: She is not in acute distress.     Appearance: Normal appearance. She is not ill-appearing or diaphoretic.  Cardiovascular:     Rate and Rhythm: Normal rate and regular rhythm.  Pulmonary:     Effort: Pulmonary effort is normal.  Abdominal:     General: Abdomen is flat. Bowel sounds are normal. There is no distension.     Palpations: Abdomen is soft.     Tenderness: There is no abdominal tenderness.  Skin:    General: Skin is warm and dry.     Capillary Refill: Capillary refill takes less than 2 seconds.  Neurological:     Mental Status: She is alert. Mental status is at baseline.  Psychiatric:        Mood and Affect: Mood normal.        Behavior: Behavior normal.     ED Results / Procedures / Treatments   Labs (all labs ordered are listed, but only abnormal results are displayed) Labs Reviewed  WET PREP, GENITAL - Abnormal; Notable for the following components:      Result Value   Clue Cells Wet Prep HPF POC PRESENT (*)    All other components within normal limits  URINALYSIS, ROUTINE W REFLEX MICROSCOPIC  PREGNANCY, URINE  GC/CHLAMYDIA PROBE AMP ([place]) NOT AT [facility]    EKG None  Radiology No results found.  Procedures Procedures    Medications Ordered in ED Medications - No  data to display  ED Course/ Medical Decision Making/ A&P                           Medical Decision Making Amount and/or Complexity of Data Reviewed Labs: ordered.   This patient presents to the ED with chief complaint(s) of vaginal itching and discomfort, and increased urinary frequency with pertinent past medical history of BV.The complaint involves an extensive differential diagnosis and also carries with it a high risk of complications and morbidity.    The differential diagnosis includes gonorrhea, chlamydia, bacterial vaginosis, trichomoniasis, yeast infection  The initial plan is to obtain UA, GC/chlamydia probe, and wet prep  Additional history obtained: None  Initial Assessment:   On exam, patient is a  well-appearing 31 year old female in no acute distress.  Abdomen is soft and nontender to palpation.  Patient self swabbed, pelvic examination deferred by patient.  Independent ECG/labs interpretation:  The following labs were independently interpreted:  Wet prep positive for clue cells, indicative of bacterial vaginosis; negative for yeast or trichomoniasis UA unremarkable, no evidence of UTI GC/chlamydia still pending  Independent visualization and interpretation of imaging: I independently visualized the following imaging with scope of interpretation limited to determining acute life threatening conditions related to emergency care: Not indicated  Disposition:   The patient has been appropriately medically screened and/or stabilized in the ED. I have low suspicion for any other emergent medical condition which would require further screening, evaluation or treatment in the ED or require inpatient management. At time of discharge the patient is hemodynamically stable and in no acute distress. I have discussed work-up results and diagnosis with patient and answered all questions. Patient is agreeable with discharge plan. We discussed strict return precautions for returning to the emergency department and they verbalized understanding.  Will send prescription for metronidazole to treat bacterial vaginosis.  Informed patient gonorrhea and Chlamydia testing are still pending, she will be notified of these results via MyChart and appropriate treatment will be initiated if either comes back positive.  Recommended follow-up with primary care physician if her symptoms do not improve.  Advised patient to not consume alcohol while taking metronidazole.          Final Clinical Impression(s) / ED Diagnoses Final diagnoses:  Bacterial vaginosis    Rx / DC Orders ED Discharge Orders          Ordered    metroNIDAZOLE (FLAGYL) 500 MG tablet  2 times daily        08/09/22 1806               Melton Alar Cordry [place], [place] 08/09/22 1813    Vanetta Mulders, MD 08/15/22 1704

## 2022-08-10 LAB — GC/CHLAMYDIA PROBE AMP (~~LOC~~) NOT AT ARMC
Chlamydia: NEGATIVE
Comment: NEGATIVE
Comment: NORMAL
Neisseria Gonorrhea: NEGATIVE

## 2023-07-08 ENCOUNTER — Encounter (HOSPITAL_BASED_OUTPATIENT_CLINIC_OR_DEPARTMENT_OTHER): Payer: Self-pay

## 2023-07-08 ENCOUNTER — Emergency Department (HOSPITAL_BASED_OUTPATIENT_CLINIC_OR_DEPARTMENT_OTHER): Payer: 59

## 2023-07-08 ENCOUNTER — Other Ambulatory Visit: Payer: Self-pay

## 2023-07-08 ENCOUNTER — Emergency Department (HOSPITAL_BASED_OUTPATIENT_CLINIC_OR_DEPARTMENT_OTHER)
Admission: EM | Admit: 2023-07-08 | Discharge: 2023-07-08 | Disposition: A | Discharge status: Home / Self Care | Payer: 59 | Attending: Emergency Medicine | Admitting: Emergency Medicine

## 2023-07-08 DIAGNOSIS — Z1152 Encounter for screening for COVID-19: Secondary | ICD-10-CM | POA: Insufficient documentation

## 2023-07-08 DIAGNOSIS — J069 Acute upper respiratory infection, unspecified: Secondary | ICD-10-CM | POA: Insufficient documentation

## 2023-07-08 DIAGNOSIS — R0602 Shortness of breath: Secondary | ICD-10-CM | POA: Diagnosis present

## 2023-07-08 LAB — RESP PANEL BY RT-PCR (RSV, FLU A&B, COVID)  RVPGX2
Influenza A by PCR: NEGATIVE
Influenza B by PCR: NEGATIVE
Resp Syncytial Virus by PCR: NEGATIVE
SARS Coronavirus 2 by RT PCR: NEGATIVE

## 2023-07-08 MED ORDER — BENZONATATE 100 MG PO CAPS: 100.0000 mg | ORAL_CAPSULE | Freq: Three times a day (TID) | ORAL | 0 refills | Status: DC

## 2023-07-08 MED ORDER — ALBUTEROL SULFATE HFA 108 (90 BASE) MCG/ACT IN AERS
2.0000 | INHALATION_SPRAY | RESPIRATORY_TRACT | Status: DC | PRN
  Administered 2023-07-08: 2 via RESPIRATORY_TRACT
  Filled 2023-07-08: qty 6.7

## 2023-07-08 MED ORDER — BENZONATATE 100 MG PO CAPS
100.0000 mg | ORAL_CAPSULE | Freq: Once | ORAL | Status: AC
  Administered 2023-07-08: 100 mg via ORAL
  Filled 2023-07-08: qty 1

## 2023-07-08 NOTE — Discharge Instructions (Signed)
You were seen in the Emergency Department for coughing and shortness of breath There was no evidence of pneumonia on your chest x-ray This is most likely a respiratory virus You can use the albuterol inhaler if that helps We have also called in a prescription for Tessalon Perles for you to pick up from pharmacy and take as directed to help with cough suppression Please follow-up with your primary care doctor morning for reevaluation Return to the emerged department for trouble breathing, chest pain or any other concerns

## 2023-07-08 NOTE — ED Notes (Signed)
Pt alert, NAD, calm, interactive, resps e/u, speaking clearly, endorses some sob, chills, and non-productive cough, denies pain at this time, also denies NVD, or fever. NSR. LS CTA.

## 2023-07-08 NOTE — ED Triage Notes (Addendum)
The patient was sick with cough and sore throat around two weeks ago. No having increased shortness of breath and the cough is getting worse. Pt refused a urine test.

## 2023-07-08 NOTE — ED Provider Notes (Signed)
[place] [facility]MENT AT [facility] Provider Note   CSN: [id number] Arrival date & time: 07/08/23  1101     History  Chief Complaint  Patient presents with   Cough   Shortness of Breath    Madison Charles is a 32 y.o. female.  With no significant past medical history presents to the ED for cough and shortness of breath.  She reports 2 weeks of waxing and waning cough and congestion and sore throat.  Feels as though her breathing and the dry cough is getting worse.  No fevers chills chest pain abdominal pain nausea vomiting or urinary symptoms.  She notes that albuterol has helped in the past with similar symptoms but has no formal diagnosis of asthma.   Cough Associated symptoms: shortness of breath   Shortness of Breath Associated symptoms: cough        Home Medications Prior to Admission medications   Medication Sig Start Date End Date Taking? Authorizing Provider  benzonatate (TESSALON) 100 MG capsule Take 1 capsule (100 mg total) by mouth every 8 (eight) hours. 07/08/23  Yes Estelle June A, DO  albuterol (PROVENTIL HFA;VENTOLIN HFA) 108 (90 BASE) MCG/ACT inhaler Inhale 2 puffs into the lungs every 6 (six) hours as needed for wheezing or shortness of breath. 03/05/14   Godfrey Pick, PA-C  ibuprofen (ADVIL,MOTRIN) 600 MG tablet Take 1 tablet (600 mg total) by mouth every 8 (eight) hours as needed. 09/11/13   Azalia Bilis, MD  metroNIDAZOLE (FLAGYL) 500 MG tablet Take 1 tablet (500 mg total) by mouth 2 (two) times daily. 08/09/22   Clark, Meghan R, PA-C  ondansetron (ZOFRAN ODT) 4 MG disintegrating tablet Take 1 tablet (4 mg total) by mouth every 8 (eight) hours as needed for nausea or vomiting. 01/22/21   Sponseller, Eugene Gavia, PA-C      Allergies    Patient has no known allergies.    Review of Systems   Review of Systems  Respiratory:  Positive for cough and shortness of breath.     Physical Exam Updated Vital Signs BP (!) 114/56    Pulse 62   Temp 98.9 F (37.2 C) (Oral)   Resp (!) 21   Ht 5\' 5"  (1.651 m)   Wt 103 kg   LMP 07/08/2023   SpO2 97%   BMI 37.79 kg/m  Physical Exam Vitals and nursing note reviewed.  HENT:     Head: Normocephalic and atraumatic.  Eyes:     Pupils: Pupils are equal, round, and reactive to light.  Cardiovascular:     Rate and Rhythm: Normal rate and regular rhythm.  Pulmonary:     Effort: Pulmonary effort is normal.     Breath sounds: Normal breath sounds.  Abdominal:     Palpations: Abdomen is soft.     Tenderness: There is no abdominal tenderness.  Skin:    General: Skin is warm and dry.  Neurological:     Mental Status: She is alert.  Psychiatric:        Mood and Affect: Mood normal.     ED Results / Procedures / Treatments   Labs (all labs ordered are listed, but only abnormal results are displayed) Labs Reviewed  RESP PANEL BY RT-PCR (RSV, FLU A&B, COVID)  RVPGX2    EKG None  Radiology DG Chest 2 View  Result Date: 07/08/2023 CLINICAL DATA:  Cough and congestion. EXAM: CHEST - 2 VIEW COMPARISON:  Chest x-ray 09/11/2013 FINDINGS: The heart size and  mediastinal contours are within normal limits. Both lungs are clear. The visualized skeletal structures are unremarkable. IMPRESSION: No active cardiopulmonary disease. Electronically Signed   By: Marin Roberts M.D.   On: 07/08/2023 11:58    Procedures Procedures    Medications Ordered in ED Medications  albuterol (VENTOLIN HFA) 108 (90 Base) MCG/ACT inhaler 2 puff (2 puffs Inhalation Given 07/08/23 1245)  benzonatate (TESSALON) capsule 100 mg (has no administration in time range)    ED Course/ Medical Decision Making/ A&P                                 Medical Decision Making 32 year old female with history as above presenting for 2 weeks of waxing waning cough and shortness of breath.  Afebrile and well-appearing on exam.  Suspect this is most likely viral URI with delayed recovery.  Will trial  albuterol MDI here to help with coughing along with Tessalon Perles.  Chest x-ray shows no focal consolidation that would be worrisome for pneumonia.  No reported chest pain.  No significant risk factors for PE or ACS.  Will provide MDI with spacer here in the ED, prescription for Jerilynn Som and instruction for PCP follow-up.  Return precautions discussed in detail.  Amount and/or Complexity of Data Reviewed Radiology: ordered.  Risk Prescription drug management.           Final Clinical Impression(s) / ED Diagnoses Final diagnoses:  Viral URI with cough    Rx / DC Orders ED Discharge Orders          Ordered    benzonatate (TESSALON) 100 MG capsule  Every 8 hours        07/08/23 1247              Royanne Foots, DO 07/08/23 1247

## 2023-08-24 ENCOUNTER — Other Ambulatory Visit: Payer: Self-pay

## 2023-08-24 ENCOUNTER — Encounter (HOSPITAL_BASED_OUTPATIENT_CLINIC_OR_DEPARTMENT_OTHER): Payer: Self-pay

## 2023-08-24 ENCOUNTER — Emergency Department (HOSPITAL_BASED_OUTPATIENT_CLINIC_OR_DEPARTMENT_OTHER)
Admission: EM | Admit: 2023-08-24 | Discharge: 2023-08-24 | Disposition: A | Discharge status: Home / Self Care | Payer: 59 | Attending: Emergency Medicine | Admitting: Emergency Medicine

## 2023-08-24 DIAGNOSIS — Z20822 Contact with and (suspected) exposure to covid-19: Secondary | ICD-10-CM | POA: Diagnosis not present

## 2023-08-24 DIAGNOSIS — J029 Acute pharyngitis, unspecified: Secondary | ICD-10-CM | POA: Insufficient documentation

## 2023-08-24 LAB — GROUP A STREP BY PCR: Group A Strep by PCR: NOT DETECTED

## 2023-08-24 LAB — RESP PANEL BY RT-PCR (RSV, FLU A&B, COVID)  RVPGX2
Influenza A by PCR: NEGATIVE
Influenza B by PCR: NEGATIVE
Resp Syncytial Virus by PCR: NEGATIVE
SARS Coronavirus 2 by RT PCR: NEGATIVE

## 2023-08-24 LAB — GC/CHLAMYDIA PROBE AMP (~~LOC~~) NOT AT ARMC
Chlamydia: NEGATIVE
Comment: NEGATIVE
Comment: NORMAL
Neisseria Gonorrhea: NEGATIVE

## 2023-08-24 LAB — PREGNANCY, URINE: Preg Test, Ur: NEGATIVE

## 2023-08-24 MED ORDER — NAPROXEN 375 MG PO TABS: 375.0000 mg | ORAL_TABLET | Freq: Two times a day (BID) | ORAL | 0 refills | Status: DC

## 2023-08-24 NOTE — ED Notes (Signed)
 Cannot discharge, registration in chart.

## 2023-08-24 NOTE — ED Provider Notes (Signed)
 [place] [facility] AT [facility] Provider Note   CSN: [id number] Arrival date & time: 08/24/23  0915     History  Chief Complaint  Patient presents with   Sore Throat    Madison Charles is a 33 y.o. female who is emergency department for with a chief complaint of sore throat.  Symptoms are primarily right-sided.  She has had them for 3 days.  Pain is worse with swallowing.  She took over-the-counter medications without resolution of symptoms and so came in.  Patient also wants STI testing but denies any vaginal symptoms or pelvic pain.  She has no urinary symptoms.   Sore Throat       Home Medications Prior to Admission medications   Medication Sig Start Date End Date Taking? Authorizing Provider  albuterol  (PROVENTIL  HFA;VENTOLIN  HFA) 108 (90 BASE) MCG/ACT inhaler Inhale 2 puffs into the lungs every 6 (six) hours as needed for wheezing or shortness of breath. 03/05/14   Egan, Eleanore E, PA-C  benzonatate  (TESSALON ) 100 MG capsule Take 1 capsule (100 mg total) by mouth every 8 (eight) hours. 07/08/23   Pamella Ozell LABOR, DO  ibuprofen  (ADVIL ,MOTRIN ) 600 MG tablet Take 1 tablet (600 mg total) by mouth every 8 (eight) hours as needed. 09/11/13   Baxter Drivers, MD  metroNIDAZOLE  (FLAGYL ) 500 MG tablet Take 1 tablet (500 mg total) by mouth 2 (two) times daily. 08/09/22   Clark, Meghan R, PA-C  ondansetron  (ZOFRAN  ODT) 4 MG disintegrating tablet Take 1 tablet (4 mg total) by mouth every 8 (eight) hours as needed for nausea or vomiting. 01/22/21   Sponseller, Rebekah R, PA-C      Allergies    Patient has no known allergies.    Review of Systems   Review of Systems  Physical Exam Updated Vital Signs BP 134/73   Pulse 73   Temp (!) 97.3 F (36.3 C) (Oral)   Resp 18   Ht 5' 5 (1.651 m)   Wt 113.4 kg   LMP 08/15/2023 (Exact Date)   SpO2 100%   BMI 41.60 kg/m  Physical Exam Vitals and nursing note reviewed.  Constitutional:      General: She is  not in acute distress.    Appearance: She is well-developed. She is not diaphoretic.  HENT:     Head: Normocephalic and atraumatic.     Comments: Oropharynx is clear and moist, very mild erythema noted, no tonsillar swelling or exudates, uvula midline.  She has no palpable tonsillar lymphadenopathy, dentition is normal without evidence of infection, bilateral ears without erythema, she appears to have some effusions.  No ear pain noted.    Right Ear: External ear normal.     Left Ear: External ear normal.     Nose: Nose normal.     Mouth/Throat:     Mouth: Mucous membranes are moist.     Pharynx: Uvula midline.  Eyes:     General: No scleral icterus.    Conjunctiva/sclera: Conjunctivae normal.  Cardiovascular:     Rate and Rhythm: Normal rate and regular rhythm.     Heart sounds: Normal heart sounds. No murmur heard.    No friction rub. No gallop.  Pulmonary:     Effort: Pulmonary effort is normal. No respiratory distress.     Breath sounds: Normal breath sounds.  Abdominal:     General: Bowel sounds are normal. There is no distension.     Palpations: Abdomen is soft. There is no mass.  Tenderness: There is no abdominal tenderness. There is no guarding.  Musculoskeletal:     Cervical back: Normal range of motion.  Skin:    General: Skin is warm and dry.  Neurological:     Mental Status: She is alert and oriented to person, place, and time.  Psychiatric:        Behavior: Behavior normal.     ED Results / Procedures / Treatments   Labs (all labs ordered are listed, but only abnormal results are displayed) Labs Reviewed  GROUP A STREP BY PCR  RESP PANEL BY RT-PCR (RSV, FLU A&B, COVID)  RVPGX2  URINALYSIS, ROUTINE W REFLEX MICROSCOPIC  PREGNANCY, URINE  GC/CHLAMYDIA PROBE AMP ([place]) NOT AT [facility]    EKG None  Radiology No results found.  Procedures Procedures    Medications Ordered in ED Medications - No data to display  ED Course/ Medical Decision  Making/ A&P                                 Medical Decision Making Amount and/or Complexity of Data Reviewed Labs: ordered.   Patient here with URI symptoms.  She is wanting asymptomatic STI testing without any symptoms of vaginal infection or pelvic pain.  Patient allowed to self swab.  I reviewed patient's triage orders including respiratory panel and group A strep both of which are negative.  Will discharge with anti-inflammatory medications.  Symptomatic treatment.  She may follow-up on her STI results on my chart.  Will provide work note, strict turn precautions.        Final Clinical Impression(s) / ED Diagnoses Final diagnoses:  None    Rx / DC Orders ED Discharge Orders     None         Arloa Chroman, PA-C 08/24/23 1118    Nicholaus Cassondra DEL, MD 08/31/23 1042

## 2023-08-24 NOTE — Discharge Instructions (Signed)
 Your respiratory panel is negative for COVID, RSV or flu.  Your strep test is also negative for strep throat.  You likely have a viral infection.  You may take the anti-inflammatory medications I have prescribed.  Gargle with warm salt water, you may use over-the-counter medications like Cepacol or throat lozenges.  You may follow-up on your STD testing through MyChart. Get help right away if: You have difficulty breathing. You cannot swallow fluids, soft foods, or your saliva. You have increased swelling in your throat or neck. You have persistent nausea and vomiting. These symptoms may represent a serious problem that is an emergency. Do not wait to see if the symptoms will go away. Get medical help right away. Call your local emergency services (911 in the [place] Do not drive yourself to the hospital.

## 2023-08-24 NOTE — ED Triage Notes (Signed)
 Pt reports sore throat x 3 days. Hurts to swallow per pt. Denies fever.

## 2023-08-24 NOTE — ED Notes (Signed)

## 2023-09-06 ENCOUNTER — Ambulatory Visit: Payer: 59 | Admitting: Family Medicine

## 2023-09-06 ENCOUNTER — Other Ambulatory Visit (HOSPITAL_BASED_OUTPATIENT_CLINIC_OR_DEPARTMENT_OTHER): Payer: Self-pay

## 2023-09-06 ENCOUNTER — Encounter: Payer: Self-pay | Admitting: Family Medicine

## 2023-09-06 ENCOUNTER — Ambulatory Visit: Payer: Self-pay | Admitting: Family Medicine

## 2023-09-06 VITALS — BP 115/72 | HR 76 | Ht 65.0 in | Wt 235.0 lb

## 2023-09-06 DIAGNOSIS — B9689 Other specified bacterial agents as the cause of diseases classified elsewhere: Secondary | ICD-10-CM | POA: Diagnosis not present

## 2023-09-06 DIAGNOSIS — Z0000 Encounter for general adult medical examination without abnormal findings: Secondary | ICD-10-CM

## 2023-09-06 DIAGNOSIS — R5383 Other fatigue: Secondary | ICD-10-CM

## 2023-09-06 DIAGNOSIS — J029 Acute pharyngitis, unspecified: Secondary | ICD-10-CM

## 2023-09-06 DIAGNOSIS — E611 Iron deficiency: Secondary | ICD-10-CM | POA: Diagnosis not present

## 2023-09-06 DIAGNOSIS — E559 Vitamin D deficiency, unspecified: Secondary | ICD-10-CM

## 2023-09-06 DIAGNOSIS — Z1159 Encounter for screening for other viral diseases: Secondary | ICD-10-CM

## 2023-09-06 DIAGNOSIS — E538 Deficiency of other specified B group vitamins: Secondary | ICD-10-CM | POA: Diagnosis not present

## 2023-09-06 DIAGNOSIS — J019 Acute sinusitis, unspecified: Secondary | ICD-10-CM | POA: Diagnosis not present

## 2023-09-06 DIAGNOSIS — E162 Hypoglycemia, unspecified: Secondary | ICD-10-CM

## 2023-09-06 LAB — CBC WITH DIFFERENTIAL/PLATELET
Basophils Absolute: 0 10*3/uL (ref 0.0–0.1)
Basophils Relative: 0.5 % (ref 0.0–3.0)
Eosinophils Absolute: 0.2 10*3/uL (ref 0.0–0.7)
Eosinophils Relative: 3 % (ref 0.0–5.0)
HCT: 40.5 % (ref 36.0–46.0)
Hemoglobin: 13.1 g/dL (ref 12.0–15.0)
Lymphocytes Relative: 42 % (ref 12.0–46.0)
Lymphs Abs: 3 10*3/uL (ref 0.7–4.0)
MCHC: 32.4 g/dL (ref 30.0–36.0)
MCV: 86 fL (ref 78.0–100.0)
Monocytes Absolute: 0.6 10*3/uL (ref 0.1–1.0)
Monocytes Relative: 7.9 % (ref 3.0–12.0)
Neutro Abs: 3.3 10*3/uL (ref 1.4–7.7)
Neutrophils Relative %: 46.6 % (ref 43.0–77.0)
Platelets: 242 10*3/uL (ref 150.0–400.0)
RBC: 4.72 Mil/uL (ref 3.87–5.11)
RDW: 13.3 % (ref 11.5–15.5)
WBC: 7.2 10*3/uL (ref 4.0–10.5)

## 2023-09-06 LAB — COMPREHENSIVE METABOLIC PANEL
ALT: 16 U/L (ref 0–35)
AST: 20 U/L (ref 0–37)
Albumin: 4.4 g/dL (ref 3.5–5.2)
Alkaline Phosphatase: 55 U/L (ref 39–117)
BUN: 15 mg/dL (ref 6–23)
CO2: 26 meq/L (ref 19–32)
Calcium: 9.5 mg/dL (ref 8.4–10.5)
Chloride: 105 meq/L (ref 96–112)
Creatinine, Ser: 0.8 mg/dL (ref 0.40–1.20)
GFR: 98.76 mL/min (ref >60.00)
Glucose, Bld: 47 mg/dL — CL (ref 70–99)
Potassium: 4.1 meq/L (ref 3.5–5.1)
Sodium: 139 meq/L (ref 135–145)
Total Bilirubin: 0.7 mg/dL (ref 0.2–1.2)
Total Protein: 7.2 g/dL (ref 6.0–8.3)

## 2023-09-06 LAB — IBC + FERRITIN
Ferritin: 92.9 ng/mL (ref 10.0–291.0)
Iron: 78 ug/dL (ref 42–145)
Saturation Ratios: 21.8 % (ref 20.0–50.0)
TIBC: 358.4 ug/dL (ref 250.0–450.0)
Transferrin: 256 mg/dL (ref 212.0–360.0)

## 2023-09-06 LAB — TSH: TSH: 1.57 u[IU]/mL (ref 0.35–5.50)

## 2023-09-06 LAB — VITAMIN D 25 HYDROXY (VIT D DEFICIENCY, FRACTURES): VITD: 17.21 ng/mL — ABNORMAL LOW (ref 30.00–100.00)

## 2023-09-06 LAB — B12 AND FOLATE PANEL
Folate: 19 ng/mL (ref >5.9)
Vitamin B-12: 522 pg/mL (ref 211–911)

## 2023-09-06 MED ORDER — AMOXICILLIN-POT CLAVULANATE 875-125 MG PO TABS
1.0000 | ORAL_TABLET | Freq: Two times a day (BID) | ORAL | 0 refills | Status: AC
  Filled 2023-09-06: qty 14, 7d supply, fill #0

## 2023-09-06 MED ORDER — FLUTICASONE PROPIONATE 50 MCG/ACT NA SUSP
2.0000 | Freq: Every day | NASAL | 6 refills | Status: DC
  Filled 2023-09-06: qty 16, 30d supply, fill #0

## 2023-09-06 NOTE — Assessment & Plan Note (Signed)
 Not on supplementation.  Labs today

## 2023-09-06 NOTE — Patient Instructions (Signed)
 Thank you for choosing [facility] at [facility] for your Primary Care needs. I am excited for the opportunity to partner with you to meet your health care goals. It was a pleasure meeting you today!  Information on diet, exercise, and health maintenance recommendations are listed below. This is information to help you be sure you are on track for optimal health and monitoring.   Please look over this and let us know if you have any questions or if you have completed any of the health maintenance outside of [facility] so that we can be sure your records are up to date.  ___________________________________________________________  MyChart:  For all urgent or time sensitive needs we ask that you please call the office to avoid delays. Our number is [phone]. MyChart is not constantly monitored and due to the large volume of messages a day, replies may take up to 72 business hours.  MyChart Policy: MyChart allows for you to see your visit notes, after visit summary, provider recommendations, lab and tests results, make an appointment, request refills, and contact your provider or the office for non-urgent questions or concerns. Providers are seeing patients during normal business hours and do not have built in time to review MyChart messages.  We ask that you allow a minimum of 3 business days for responses to [employer]. For this reason, please do not send urgent requests through MyChart. Please call the office at [phone]. New and ongoing conditions may require a visit. We have virtual and in-person visits available for your convenience.  Complex MyChart concerns may require a visit. Your provider may request you schedule a virtual or in-person visit to ensure we are providing the best care possible. MyChart messages sent after 11:00 AM on Friday may not be received by the provider until Monday morning.    Lab and Test Results: You will receive your lab and test  results on MyChart as soon as they are completed and results have been sent by the lab or testing facility. Due to this service, you will receive your results BEFORE your provider.  I review lab and test results each morning prior to seeing patients. Some results require collaboration with other providers to ensure you are receiving the most appropriate care. For this reason, we ask that you please allow a minimum of 3-5 business days from the time that ALL results have been received for your provider to receive and review lab and test results and contact you about these.  Most lab and test result comments from the provider will be sent through MyChart. Your provider may recommend changes to the plan of care, follow-up visits, repeat testing, ask questions, or request an office visit to discuss these results. You may reply directly to this message or call the office to provide information for the provider or set up an appointment. In some instances, you will be called with test results and recommendations. Please let us know if this is preferred and we will make note of this in your chart to provide this for you.    If you have not heard a response to your lab or test results in 5 business days from all results returning to MyChart, please call the office to let us know. We ask that you please avoid calling prior to this time unless there is an emergent concern. Due to high call volumes, this can delay the resulting process.  After Hours: For all non-emergency after hours needs, please  call the office at [phone] and select the option to reach the on-call  service. On-call services are shared between multiple [place] offices and therefore it will not be possible to speak directly with your provider. On-call providers may provide medical advice and recommendations, but are unable to provide refills for maintenance medications.  For all emergency or urgent medical needs after normal business hours, we  recommend that you seek care at the closest Urgent Care or Emergency Department to ensure appropriate treatment in a timely manner.  [facility] has a 24 hour emergency room located on the ground floor for your convenience.   Urgent Concerns During the Business Day Providers are seeing patients from 8AM to 5PM with a busy schedule and are most often not able to respond to non-urgent calls until the end of the day or the next business day. If you should have URGENT concerns during the day, please call and speak to the nurse or schedule a same day appointment so that we can address your concern without delay.   Thank you, again, for choosing me as your health care partner. I appreciate your trust and look forward to learning more about you!   Madison Marrow Reola Calkins, DNP, FNP-C  ___________________________________________________________  Health Maintenance Recommendations Screening Testing Mammogram Every 1-2 years based on history and risk factors Starting at age 60 Pap Smear Ages 21-39 every 3 years Ages 22-65 every 5 years with HPV testing More frequent testing may be required based on results and history Colon Cancer Screening Every 1-10 years based on test performed, risk factors, and history Starting at age 29 Bone Density Screening Every 2-10 years based on history Starting at age 79 for women Recommendations for men differ based on medication usage, history, and risk factors AAA Screening One time ultrasound Men 59-28 years old who have ever smoked Lung Cancer Screening Low Dose Lung CT every 12 months Age 51-80 years with a 20 pack-year smoking history who still smoke or who have quit within the last 15 years  Screening Labs Routine  Labs: Complete Blood Count (CBC), Complete Metabolic Panel (CMP), Cholesterol (Lipid Panel) Every 6-12 months based on history and medications May be recommended more frequently based on current conditions or previous results Hemoglobin  A1c Lab Every 3-12 months based on history and previous results Starting at age 79 or earlier with diagnosis of diabetes, high cholesterol, BMI >26, and/or risk factors Frequent monitoring for patients with diabetes to ensure blood sugar control Thyroid Panel  Every 6 months based on history, symptoms, and risk factors May be repeated more often if on medication HIV One time testing for all patients 66 and older May be repeated more frequently for patients with increased risk factors or exposure Hepatitis C One time testing for all patients 59 and older May be repeated more frequently for patients with increased risk factors or exposure Gonorrhea, Chlamydia Every 12 months for all sexually active persons 13-24 years Additional monitoring may be recommended for those who are considered high risk or who have symptoms PSA Men 69-17 years old with risk factors Additional screening may be recommended from age 43-69 based on risk factors, symptoms, and history  Vaccine Recommendations Tetanus Booster All adults every 10 years Flu Vaccine All patients 6 months and older every year COVID Vaccine All patients 12 years and older Initial dosing with booster May recommend additional booster based on age and health history HPV Vaccine 2 doses all patients age 71-26 Dosing may be considered  for patients over 26 Shingles Vaccine (Shingrix) 2 doses all adults 50 years and older Pneumonia (Pneumovax 23) All adults 65 years and older May recommend earlier dosing based on health history Pneumonia (Prevnar 53) All adults 65 years and older Dosed 1 year after Pneumovax 23 Pneumonia (Prevnar 20) All adults 65 years and older (adults 19-64 with certain conditions or risk factors) 1 dose  For those who have not received Prevnar 13 vaccine previously   Additional Screening, Testing, and Vaccinations may be recommended on an individualized basis based on family history, health history, risk  factors, and/or exposure.  __________________________________________________________  Diet Recommendations for All Patients  I recommend that all patients maintain a diet low in saturated fats, carbohydrates, and cholesterol. While this can be challenging at first, it is not impossible and small changes can make big differences.  Things to try: Decreasing the amount of soda, sweet tea, and/or juice to one or less per day and replace with water While water is always the first choice, if you do not like water you may consider adding a water additive without sugar to improve the taste other sugar free drinks Replace potatoes with a brightly colored vegetable  Use healthy oils, such as canola oil or olive oil, instead of butter or hard margarine Limit your bread intake to two pieces or less a day Replace regular pasta with low carb pasta options Bake, broil, or grill foods instead of frying Monitor portion sizes  Eat smaller, more frequent meals throughout the day instead of large meals  An important thing to remember is, if you love foods that are not great for your health, you don't have to give them up completely. Instead, allow these foods to be a reward when you have done well. Allowing yourself to still have special treats every once in a while is a nice way to tell yourself thank you for working hard to keep yourself healthy.   Also remember that every day is a new day. If you have a bad day and "fall off the wagon", you can still climb right back up and keep moving along on your journey!  We have resources available to help you!  Some websites that may be helpful include: www.http://www.wall-moore.info/  Www.VeryWellFit.com _____________________________________________________________  Activity Recommendations for All Patients  I recommend that all adults get at least 30 minutes of moderate physical activity that elevates your heart rate at least 5 days out of the week.  Some examples  include: Walking or jogging at a pace that allows you to carry on a conversation Cycling (stationary bike or outdoors) Water aerobics Yoga Weight lifting Dancing If physical limitations prevent you from putting stress on your joints, exercise in a pool or seated in a chair are excellent options.  Do determine your MAXIMUM heart rate for activity: 220 - YOUR AGE = MAX Heart Rate   Remember! Do not push yourself too hard.  Start slowly and build up your pace, speed, weight, time in exercise, etc.  Allow your body to rest between exercise and get good sleep. You will need more water than normal when you are exerting yourself. Do not wait until you are thirsty to drink. Drink with a purpose of getting in at least 8, 8 ounce glasses of water a day plus more depending on how much you exercise and sweat.    If you begin to develop dizziness, chest pain, abdominal pain, jaw pain, shortness of breath, headache, vision changes, lightheadedness, or other concerning symptoms,  stop the activity and allow your body to rest. If your symptoms are severe, seek emergency evaluation immediately. If your symptoms are concerning, but not severe, please let us know so that we can recommend further evaluation.

## 2023-09-06 NOTE — Progress Notes (Signed)
 New Patient Office Visit  Subjective    Patient ID: Madison Charles, female    DOB: 1991-04-18  Age: 33 y.o. MRN: [id number]  CC:  Chief Complaint  Patient presents with   Establish Care    HPI [name] presents to establish care. She live's with her sister, Elijah Guadalajara. She works in a [employer].     Discussed the use of AI scribe software for clinical note transcription with the patient, who gave verbal consent to proceed.  History of Present Illness   The patient, a Company secretary, presents with a two-week history of left-sided throat pain, which she describes as an 'achy' sensation. The pain is located primarily below the ear and lower throat region, and is severe enough to cause difficulty swallowing on some days. The patient reports that the pain eases slightly with the use of over-the-counter analgesics such as Tylenol  or Naproxen . She denies any associated fever, cough, sneezing, or exposure to sick contacts.  The patient also reports a history of anemia requiring blood transfusion in 2019, and iron infusion in 2020. She expresses feeling constantly tired, despite adequate sleep, which could be attributed to her night shift work schedule.  The patient's medical history includes allergies, an ectopic pregnancy in 2019 resulting in the removal of the right fallopian tube, and a past infection with Trichomonas that was treated and resolved two years ago. She lives with her sister and is currently not on any regular medications, except for an as-needed inhaler and Naproxen .  The patient's family history is significant for diabetes and heart disease in the father, COPD in the mother, diabetes in a sister, colon cancer in a grandmother, and thyroid cancer in an aunt on the maternal side. The patient denies any recent sexual activity that would necessitate STD screening. She is not currently on any form of birth control and reports regular menstrual cycles.  The  patient has not had a primary care visit since 2019 and has not had any recent labs. She expresses interest in updating her blood work.              Outpatient Encounter Medications as of 09/06/2023  Medication Sig   albuterol  (PROVENTIL  HFA;VENTOLIN  HFA) 108 (90 BASE) MCG/ACT inhaler Inhale 2 puffs into the lungs every 6 (six) hours as needed for wheezing or shortness of breath.   amoxicillin -clavulanate (AUGMENTIN ) 875-125 MG tablet Take 1 tablet by mouth 2 (two) times daily for 7 days.   fluticasone  (FLONASE ) 50 MCG/ACT nasal spray Place 2 sprays into both nostrils daily.   naproxen  (NAPROSYN ) 375 MG tablet Take 1 tablet (375 mg total) by mouth 2 (two) times daily with a meal.   [DISCONTINUED] benzonatate  (TESSALON ) 100 MG capsule Take 1 capsule (100 mg total) by mouth every 8 (eight) hours.   [DISCONTINUED] ibuprofen  (ADVIL ,MOTRIN ) 600 MG tablet Take 1 tablet (600 mg total) by mouth every 8 (eight) hours as needed.   [DISCONTINUED] metroNIDAZOLE  (FLAGYL ) 500 MG tablet Take 1 tablet (500 mg total) by mouth 2 (two) times daily.   [DISCONTINUED] ondansetron  (ZOFRAN  ODT) 4 MG disintegrating tablet Take 1 tablet (4 mg total) by mouth every 8 (eight) hours as needed for nausea or vomiting.   No facility-administered encounter medications on file as of 09/06/2023.    Past Medical History:  Diagnosis Date   Allergy    Anemia    Blood transfusion without reported diagnosis    Trichomonas 2 yrs ago   tx and resolved  Past Surgical History:  Procedure Laterality Date   ECTOPIC PREGNANCY SURGERY  2019   R fallopian tube removed   TONSILLECTOMY Bilateral around 8 or 33 y.o.    Family History  Problem Relation Age of Onset   Diabetes Father    Heart disease Father    Diabetes Sister    COPD Mother    Cancer Maternal Grandmother        colon   Heart disease Maternal Grandfather        MI   Cancer Maternal Aunt        thyroid    Social History   Socioeconomic History    Marital status: Single    Spouse name: Not on file   Number of children: Not on file   Years of education: Not on file   Highest education level: 12th grade  Occupational History   Occupation: Consulting civil engineer    Comment: Futures trader   Occupation: Lobbyist: OTHER    Comment: [employer]  Tobacco Use   Smoking status: Former    Types: E-cigarettes   Smokeless tobacco: Never  Advertising account planner   Vaping status: Never Used  Substance and Sexual Activity   Alcohol use: Yes    Comment: occ   Drug use: No   Sexual activity: Yes    Birth control/protection: None  Other Topics Concern   Not on file  Social History Narrative   Not on file   Social Drivers of Health   Financial Resource Strain: Low Risk  (09/05/2023)   Overall Financial Resource Strain (CARDIA)    Difficulty of Paying Living Expenses: Not hard at all  Food Insecurity: No Food Insecurity (09/05/2023)   Hunger Vital Sign    Worried About Running Out of Food in the Last Year: Never true    Ran Out of Food in the Last Year: Never true  Transportation Needs: No Transportation Needs (09/05/2023)   PRAPARE - Administrator, Civil Service (Medical): No    Lack of Transportation (Non-Medical): No  Physical Activity: Sufficiently Active (09/05/2023)   Exercise Vital Sign    Days of Exercise per Week: 7 days    Minutes of Exercise per Session: 150+ min  Stress: No Stress Concern Present (09/05/2023)   [employer] of Occupational Health - Occupational Stress Questionnaire    Feeling of Stress : Only a little  Social Connections: Moderately Isolated (09/05/2023)   Social Connection and Isolation Panel [NHANES]    Frequency of Communication with Friends and Family: More than three times a week    Frequency of Social Gatherings with Friends and Family: Twice a week    Attends Religious Services: 1 to 4 times per year    Active Member of [employer] or Organizations: No    Attends Museum/gallery exhibitions officer: Not on file    Marital Status: Never married  Intimate Partner Violence: Not on file    ROS All review of systems negative except what is listed in the HPI      Objective    BP 115/72   Pulse 76   Ht 5\' 5"  (1.651 m)   Wt 235 lb (106.6 kg)   LMP 08/15/2023 (Exact Date)   SpO2 99%   BMI 39.11 kg/m   Physical Exam Vitals reviewed.  Constitutional:      Appearance: Normal appearance. She is obese.  HENT:     Left Ear: A middle ear effusion is present.  Tympanic membrane is erythematous.     Nose:     Right Turbinates: Swollen.     Left Turbinates: Swollen.     Mouth/Throat:     Mouth: Mucous membranes are moist.     Pharynx: No posterior oropharyngeal erythema.     Comments: Cobblestoning/PND Cardiovascular:     Rate and Rhythm: Normal rate and regular rhythm.     Heart sounds: Normal heart sounds.  Pulmonary:     Effort: Pulmonary effort is normal.     Breath sounds: Normal breath sounds.  Musculoskeletal:     Cervical back: Normal range of motion and neck supple. No tenderness.  Lymphadenopathy:     Cervical: No cervical adenopathy.  Skin:    General: Skin is warm and dry.  Neurological:     Mental Status: She is alert and oriented to person, place, and time.  Psychiatric:        Mood and Affect: Mood normal.        Behavior: Behavior normal.        Thought Content: Thought content normal.        Judgment: Judgment normal.              Assessment & Plan:   Problem List Items Addressed This Visit       Active Problems   Iron deficiency   Not on supplementation.  Labs today.       B12 deficiency   Not on supplementation.  Labs today       Other Visit Diagnoses       Acute bacterial rhinosinusitis    -  Primary Sore throat   Given duration along with physical exam (nasal congestion/erythema/swelling, left ear effusion with some erythema, normal throat exam and cervical lymph nodes) will go ahead and try Augmentin   and Flonase . Continue supportive measures including rest, hydration, humidifier use, steam showers, warm compresses to sinuses, warm liquids with lemon and honey, and over-the-counter cough, cold, and analgesics as needed.  Please contact office for follow-up if symptoms do not improve or worsen. Seek emergency care if symptoms become severe.     Relevant Medications   amoxicillin -clavulanate (AUGMENTIN ) 875-125 MG tablet   fluticasone  (FLONASE ) 50 MCG/ACT nasal spray     Encounter for medical examination to establish care          Hypocalcemia     Recently low. Recheck labs.    Relevant Orders   Comprehensive metabolic panel   VITAMIN D  25 Hydroxy (Vit-D Deficiency, Fractures)     Fatigue, unspecified type   Labs today. Night shift work could be contributing as well.     Relevant Orders   CBC with Differential/Platelet   Comprehensive metabolic panel   W09 and Folate Panel   TSH   IBC + Ferritin          Relevant Medications   amoxicillin -clavulanate (AUGMENTIN ) 875-125 MG tablet     Encounter for hepatitis C screening test for low risk patient       Relevant Orders   Hepatitis C antibody       Return in about 6 months (around 03/05/2024) for physical.   Everlina Hock, NP

## 2023-09-06 NOTE — Telephone Encounter (Signed)
 [facility] representative Saa Bayon called to report critical glucose of 47 - called on provider and reported to Sandra Crouch - confirmed

## 2023-09-07 ENCOUNTER — Other Ambulatory Visit: Payer: 59

## 2023-09-07 ENCOUNTER — Other Ambulatory Visit (HOSPITAL_BASED_OUTPATIENT_CLINIC_OR_DEPARTMENT_OTHER): Payer: Self-pay

## 2023-09-07 DIAGNOSIS — E559 Vitamin D deficiency, unspecified: Secondary | ICD-10-CM | POA: Insufficient documentation

## 2023-09-07 LAB — HEPATITIS C ANTIBODY: Hepatitis C Ab: NONREACTIVE

## 2023-09-07 MED ORDER — VITAMIN D (ERGOCALCIFEROL) 1.25 MG (50000 UNIT) PO CAPS
50000.0000 [IU] | ORAL_CAPSULE | ORAL | 0 refills | Status: DC
  Filled 2023-09-07: qty 4, 28d supply, fill #0
  Filled 2023-09-30: qty 4, 28d supply, fill #1

## 2023-09-07 NOTE — Addendum Note (Signed)
Addended by: Hyman Hopes B on: 09/07/2023 07:52 AM   Modules accepted: Orders

## 2023-09-08 ENCOUNTER — Other Ambulatory Visit: Payer: 59

## 2023-09-11 ENCOUNTER — Other Ambulatory Visit (INDEPENDENT_AMBULATORY_CARE_PROVIDER_SITE_OTHER): Payer: 59

## 2023-09-11 DIAGNOSIS — E162 Hypoglycemia, unspecified: Secondary | ICD-10-CM

## 2023-09-12 ENCOUNTER — Encounter: Payer: Self-pay | Admitting: Family Medicine

## 2023-09-12 LAB — GLUCOSE, RANDOM: Glucose, Bld: 69 mg/dL — ABNORMAL LOW (ref 70–99)

## 2023-09-29 ENCOUNTER — Encounter: Payer: Self-pay | Admitting: Family Medicine

## 2023-09-29 ENCOUNTER — Ambulatory Visit (HOSPITAL_BASED_OUTPATIENT_CLINIC_OR_DEPARTMENT_OTHER)
Admission: RE | Admit: 2023-09-29 | Discharge: 2023-09-29 | Disposition: A | Discharge status: Home / Self Care | Payer: 59 | Source: Ambulatory Visit | Attending: Family Medicine

## 2023-09-29 ENCOUNTER — Ambulatory Visit: Payer: 59 | Admitting: Family Medicine

## 2023-09-29 ENCOUNTER — Other Ambulatory Visit (HOSPITAL_BASED_OUTPATIENT_CLINIC_OR_DEPARTMENT_OTHER): Payer: Self-pay

## 2023-09-29 VITALS — BP 134/73 | HR 67 | Ht 65.0 in | Wt 239.0 lb

## 2023-09-29 DIAGNOSIS — M79642 Pain in left hand: Secondary | ICD-10-CM

## 2023-09-29 MED ORDER — DICLOFENAC SODIUM 1 % EX GEL
2.0000 g | Freq: Four times a day (QID) | CUTANEOUS | 2 refills | Status: DC
  Filled 2023-09-29: qty 100, 13d supply, fill #0

## 2023-09-29 NOTE — Progress Notes (Signed)
 Acute Office Visit  Subjective:     Patient ID: [name], female    DOB: Jan 08, 1991, 33 y.o.   MRN: [id number]  Chief Complaint  Patient presents with   Hand Pain    Hand Pain    Patient is in today for  hand pain.   Discussed the use of AI scribe software for clinical note transcription with the patient, who gave verbal consent to proceed.  History of Present Illness   Madison Charles is a 33 year old female who presents with left hand pain and stiffness.  She has been experiencing pain and stiffness in her left hand, particularly on the ulnar side, for the past couple of months. The pain is described as sore and affects the entire side of the hand, with some swelling. She finds it difficult to stretch her hand upon waking and rates the pain as a 7 out of 10 when attempting to straighten her hand. No recent bruising is noted.  She recalls a previous fracture in the same area a few years ago, which was treated with a brace for eight weeks. There was significant bruising at that time, but none currently. She has not had any x-rays since the initial fracture healed.  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  She occasionally takes Tylenol  for the pain and has naproxen  available, which she uses sometimes. She has not engaged in any physical therapy for the hand.           ROS All review of systems negative except what is listed in the HPI      Objective:    BP 134/73   Pulse 67   Ht 5' 5 (1.651 m)   Wt 239 lb (108.4 kg)   SpO2 98%   BMI 39.77 kg/m    Physical Exam Vitals reviewed.  Constitutional:      Appearance: Normal appearance.  Musculoskeletal:        General: No swelling.     Left hand: Tenderness present. No swelling. Decreased range of motion. Normal pulse.     Comments: Left hand pain, mostly lateral 5th  metacarpal and proximal phalange, some difficulty fully extending PIP  Skin:    Findings: No bruising, erythema or rash.  Neurological:     Mental Status: She is alert and oriented to person, place, and time.  Psychiatric:        Mood and Affect: Mood normal.        Behavior: Behavior normal.        Thought Content: Thought content normal.        Judgment: Judgment normal.        No results found for any visits on 09/29/23.      Assessment & Plan:   Problem List Items Addressed This Visit   None Visit Diagnoses       Left hand pain    -  Primary   Relevant Medications   diclofenac  Sodium (VOLTAREN ) 1 % GEL   Other Relevant Orders   DG Hand Complete Left   Ambulatory referral to Sports Medicine      Chronic pain and stiffness in the left hand, particularly on the pinky side. History of fractures in the area a few years ago. Recent increase in pain and difficulty in stretching the hand.  -Order left hand x-ray to assess for arthritis or  other structural abnormalities. -Prescribe Voltaren  gel up to four times a day  -Continue Naproxen  twice a day for one week, then as needed. -Advise use of Tylenol  for additional pain relief. -Sports medicine/ortho referral -Consider hand physical therapy if no structural abnormalities are found.        Meds ordered this encounter  Medications   diclofenac  Sodium (VOLTAREN ) 1 % GEL    Sig: Apply 2 g topically 4 (four) times daily.    Dispense:  100 g    Refill:  2    Supervising Provider:   DOMENICA BLACKBIRD A [[id number]]    Return if symptoms worsen or fail to improve.  Waddell KATHEE Mon, NP

## 2023-10-02 ENCOUNTER — Encounter: Payer: Self-pay | Admitting: Family Medicine

## 2023-10-04 ENCOUNTER — Ambulatory Visit: Payer: 59 | Admitting: Family Medicine

## 2023-10-04 NOTE — Progress Notes (Deleted)
 CHIEF COMPLAINT: No chief complaint on file.  _____________________________________________________________ SUBJECTIVE  HPI  Pt is a 33 y.o. female here for evaluation of L hand pain  By PCP 09/29/2023 - Pain/soreness and stiffness, ulnar-sided, past couple of months, affecting the entirety of the hand, some swelling, difficulty stretching and, weight gain, pain is 7/10 while trying to straighten out the hand -  - - L hand XR unremarkable   She recalls a previous fracture in the same area a few years ago, which was treated with a brace for eight weeks. There was significant bruising at that time, but none currently. She has not had any x-rays since the initial fracture healed.   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   She occasionally takes Tylenol for the pain and has naproxen available, which she uses sometimes. She has not engaged in any physical therapy for the hand.   Chronic pain and stiffness in the left hand, particularly on the pinky side. History of fractures in the area a few years ago. Recent increase in pain and difficulty in stretching the hand.  -Order left hand x-ray to assess for arthritis or other structural abnormalities. -Prescribe Voltaren gel up to four times a day  -Continue Naproxen twice a day for one week, then as needed. -Advise use of Tylenol for additional pain relief. -Sports medicine/ortho referral -Consider hand physical therapy if no structural abnormalities are found.       Ongoing for *** Inciting event: Primarily located   Radiating Numbness/tingling Catching/locking *** Exacerbated by Therapies tried so far:  Works as Solicitor   ------------------------------------------------------------------------------------------------------ Past Medical History:  Diagnosis Date   Allergy    Anemia    Blood  transfusion without reported diagnosis    Trichomonas 2 yrs ago   tx and resolved    Past Surgical History:  Procedure Laterality Date   ECTOPIC PREGNANCY SURGERY  2019   R fallopian tube removed   TONSILLECTOMY Bilateral around 8 or 33 y.o.      Outpatient Encounter Medications as of 10/04/2023  Medication Sig   albuterol (PROVENTIL HFA;VENTOLIN HFA) 108 (90 BASE) MCG/ACT inhaler Inhale 2 puffs into the lungs every 6 (six) hours as needed for wheezing or shortness of breath.   diclofenac Sodium (VOLTAREN) 1 % GEL Apply 2 g topically 4 (four) times daily.   fluticasone (FLONASE) 50 MCG/ACT nasal spray Place 2 sprays into both nostrils daily.   naproxen (NAPROSYN) 375 MG tablet Take 1 tablet (375 mg total) by mouth 2 (two) times daily with a meal.   Vitamin D, Ergocalciferol, (DRISDOL) 1.25 MG (50000 UNIT) CAPS capsule Take 1 capsule (50,000 Units total) by mouth every 7 (seven) days.   No facility-administered encounter medications on file as of 10/04/2023.    ------------------------------------------------------------------------------------------------------  _____________________________________________________________ OBJECTIVE  PHYSICAL EXAM  There were no vitals filed for this visit. There is no height or weight on file to calculate BMI.   reviewed  General: A+Ox3, no acute distress, well-nourished, appropriate affect CV: pulses 2+ regular, nondiaphoretic, no peripheral edema, cap refill <2sec Lungs: no audible wheezing, non-labored breathing, bilateral chest rise/fall, nontachypneic Skin: warm, well-perfused, non-icteric, no susp lesions or rashes Neuro: *** Sensation intact, muscle tone wnl, no atrophy Psych: no signs of depression or anxiety MSK: HAND/WRIST: *** wrist clean/dry/intact skin. Full ROM without pain. *** soft tissue swelling.  *** tenderness along 1st extensor compartment.  ***  Finkelstein's. *** bony tenderness.  *** snuff box tenderness.  ***tinels at  carpal tunnel, *** Phalen's.  Normal grip strength. *** tenderness over carpals, metacarpals, phalanges.  ***wrist with FROM without pain, weakness, instability.       _____________________________________________________________ ASSESSMENT/PLAN There are no diagnoses linked to this encounter.   PT, MRI   Electronically signed by: Burna Forts, MD 10/04/2023 7:47 AM

## 2023-10-06 ENCOUNTER — Ambulatory Visit (INDEPENDENT_AMBULATORY_CARE_PROVIDER_SITE_OTHER): Payer: 59 | Admitting: Family Medicine

## 2023-10-06 ENCOUNTER — Encounter: Payer: Self-pay | Admitting: Family Medicine

## 2023-10-06 VITALS — BP 112/78 | Ht 65.0 in | Wt 239.0 lb

## 2023-10-06 DIAGNOSIS — M79642 Pain in left hand: Secondary | ICD-10-CM | POA: Diagnosis not present

## 2023-10-06 NOTE — Progress Notes (Signed)
 CHIEF COMPLAINT: No chief complaint on file.  _____________________________________________________________ SUBJECTIVE  HPI  Pt is a 33 y.o. 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 pain is 7/10 while trying to straighten out the hand - History of fracture in the same area 4-5  years ago that had been treated with bracing for 8 weeks Radiating: localized to base of 5th finger, lateral aspect/ulnar sided Numbness/tingling: none Exacerbated by: attempting activity after a period of inactivity  Waking up to go to work having some stiffness  Therapies tried so far: naproxen, Tylenol, Voltaren gel - Prescribed Voltaren gel 4 times daily, naproxen twice daily x 7 days then as needed, Tylenol  Voltaren has helped the most - L hand XR reviewed, unremarkable  ------------------------------------------------------------------------------------------------------ Past Medical History:  Diagnosis Date   Allergy    Anemia    Blood transfusion without reported diagnosis    Trichomonas 2 yrs ago   tx and resolved    Past Surgical History:  Procedure Laterality Date   ECTOPIC PREGNANCY SURGERY  2019   R fallopian tube removed   TONSILLECTOMY Bilateral around 8 or 33 y.o.      Outpatient Encounter Medications as of 10/06/2023  Medication Sig   albuterol (PROVENTIL HFA;VENTOLIN HFA) 108 (90 BASE) MCG/ACT inhaler Inhale 2 puffs into the lungs every 6 (six) hours as needed for wheezing or shortness of breath.   diclofenac Sodium (VOLTAREN) 1 %  GEL Apply 2 g topically 4 (four) times daily.   fluticasone (FLONASE) 50 MCG/ACT nasal spray Place 2 sprays into both nostrils daily.   naproxen (NAPROSYN) 375 MG tablet Take 1 tablet (375 mg total) by mouth 2 (two) times daily with a meal.   Vitamin D, Ergocalciferol, (DRISDOL) 1.25 MG (50000 UNIT) CAPS capsule Take 1 capsule (50,000 Units total) by mouth every 7 (seven) days.   No facility-administered encounter medications on file as of 10/06/2023.    ------------------------------------------------------------------------------------------------------  _____________________________________________________________ OBJECTIVE  PHYSICAL EXAM  Today's Vitals   10/06/23 0958  BP: 112/78  Weight: 239 lb (108.4 kg)  Height: 5\' 5"  (1.651 m)   Body mass index is 39.77 kg/m.   reviewed  General: A+Ox3, no acute distress, well-nourished, appropriate affect CV: pulses 2+ regular, nondiaphoretic, no peripheral edema, cap refill <2sec Lungs: no audible wheezing, non-labored breathing, bilateral chest rise/fall, nontachypneic Skin: warm, well-perfused, non-icteric, no susp lesions or rashes Neuro:  Sensation intact, muscle tone wnl, no atrophy Psych: no signs of depression or anxiety MSK: HAND/WRIST: L wrist clean/dry/intact skin. Full ROM without pain. no soft tissue swelling.  no tenderness along 1st extensor compartment.  Neg Finkelstein's, snuffbox tenderness, tinel's/durkan. TTP over lateral base of 5th MCP. Some discomfort with resisted extension/flexion of the 5th MCP. Is able to form a full fist. B/l equivalent grip and lumbrical strength, no tenderness over carpals, remaining metacarpals, phalanges.  _____________________________________________________________ ASSESSMENT/PLAN Diagnoses and all orders for this visit:  Left hand pain   Reassuring physical exam today. Unremarkable findings on limited pocus. Discussed XR findings, negative XR. Suspected overuse injury from work,  ongoing 2 months, increasing in frequency over  the last month in setting of busy holiday season at [facility]. Discussed options for initial management, offered OT/PT, which was declined today in favor of HEP exercises, which were provided today. May consider bracing PRN for comfort, continue with voltaren gel. OTC therapies also reviewed. May consider formal OT referral, additional imaging work-up as appropriate. All questions answered. Return precautions discussed, anticipate f/u in 4-6 weeks, sooner as needed for worsening, refractory, or new concerns, pt to call. Patient verbalized understanding and is in agreement with plan  Electronically signed by: Burna Forts, MD 10/06/2023 7:20 AM

## 2023-11-10 ENCOUNTER — Ambulatory Visit: Admitting: Physician Assistant

## 2023-11-10 ENCOUNTER — Encounter: Payer: Self-pay | Admitting: Physician Assistant

## 2023-11-10 ENCOUNTER — Other Ambulatory Visit (HOSPITAL_BASED_OUTPATIENT_CLINIC_OR_DEPARTMENT_OTHER): Payer: Self-pay

## 2023-11-10 VITALS — BP 119/70 | HR 87 | Ht 65.0 in | Wt 245.2 lb

## 2023-11-10 DIAGNOSIS — G4486 Cervicogenic headache: Secondary | ICD-10-CM | POA: Diagnosis not present

## 2023-11-10 MED ORDER — CYCLOBENZAPRINE HCL 5 MG PO TABS
5.0000 mg | ORAL_TABLET | Freq: Three times a day (TID) | ORAL | 0 refills | Status: AC | PRN
  Filled 2023-11-10: qty 30, 10d supply, fill #0

## 2023-11-10 NOTE — Progress Notes (Signed)
      Established patient visit   Patient: Madison Charles   DOB: 10/05/1990   32 y.o. Female  MRN: [id number] Visit Date: 11/10/2023  Today's healthcare provider: Alfredia Ferguson, PA-C   Cc. Head/neck pain x 2 weeks  Subjective    Pt reports pain back of her head/back of skull for the last 2 weeks, radiates to the top of her head. She has tried naproxen, tylenol, no real relief. Denies pain radiating down arms, paresthesias, changes in vision.  Denies injury, accident.   Medications: Outpatient Medications Prior to Visit  Medication Sig   albuterol (PROVENTIL HFA;VENTOLIN HFA) 108 (90 BASE) MCG/ACT inhaler Inhale 2 puffs into the lungs every 6 (six) hours as needed for wheezing or shortness of breath.   diclofenac Sodium (VOLTAREN) 1 % GEL Apply 2 g topically 4 (four) times daily.   fluticasone (FLONASE) 50 MCG/ACT nasal spray Place 2 sprays into both nostrils daily.   naproxen (NAPROSYN) 375 MG tablet Take 1 tablet (375 mg total) by mouth 2 (two) times daily with a meal.   [DISCONTINUED] Vitamin D, Ergocalciferol, (DRISDOL) 1.25 MG (50000 UNIT) CAPS capsule Take 1 capsule (50,000 Units total) by mouth every 7 (seven) days.   No facility-administered medications prior to visit.    Review of Systems  Constitutional:  Negative for fatigue and fever.  Respiratory:  Negative for cough and shortness of breath.   Cardiovascular:  Negative for chest pain and leg swelling.  Gastrointestinal:  Negative for abdominal pain.  Musculoskeletal:  Positive for neck pain.  Neurological:  Positive for headaches. Negative for dizziness.       Objective    BP 119/70   Pulse 87   Ht 5\' 5"  (1.651 m)   Wt 245 lb 3.2 oz (111.2 kg)   BMI 40.80 kg/m    Physical Exam Constitutional:      General: She is awake.     Appearance: She is well-developed.  HENT:     Head: Normocephalic.  Eyes:     Conjunctiva/sclera: Conjunctivae normal.  Cardiovascular:     Rate and Rhythm: Normal rate  and regular rhythm.     Heart sounds: Normal heart sounds.  Pulmonary:     Effort: Pulmonary effort is normal.     Breath sounds: Normal breath sounds.  Musculoskeletal:     Comments: Some tenderness/tension to right neck. Full ROM without pain.    Skin:    General: Skin is warm.  Neurological:     Mental Status: She is alert and oriented to person, place, and time.  Psychiatric:        Attention and Perception: Attention normal.        Mood and Affect: Mood normal.        Speech: Speech normal.        Behavior: Behavior is cooperative.      No results found for any visits on 11/10/23.  Assessment & Plan    Cervicogenic headache -     Cyclobenzaprine HCl; Take 1 tablet (5 mg total) by mouth 3 (three) times daily as needed for muscle spasms.  Dispense: 30 tablet; Refill: 0   Recommending naproxen bid x 5 days, cyclobenzaprine up to TID cautioned drowsiness Recommending topical heat, light stretching  Return if symptoms worsen or fail to improve.       Alfredia Ferguson, PA-C  [facility] [phone] (phone) [phone] (fax)  [facility]

## 2023-11-15 ENCOUNTER — Ambulatory Visit: Admitting: Family Medicine

## 2023-11-30 ENCOUNTER — Other Ambulatory Visit (HOSPITAL_BASED_OUTPATIENT_CLINIC_OR_DEPARTMENT_OTHER): Payer: Self-pay

## 2023-11-30 ENCOUNTER — Ambulatory Visit: Admitting: Family Medicine

## 2023-11-30 ENCOUNTER — Encounter: Payer: Self-pay | Admitting: Family Medicine

## 2023-11-30 VITALS — BP 136/84 | HR 82 | Temp 98.0°F | Ht 65.0 in | Wt 244.0 lb

## 2023-11-30 DIAGNOSIS — J029 Acute pharyngitis, unspecified: Secondary | ICD-10-CM | POA: Diagnosis not present

## 2023-11-30 DIAGNOSIS — R0981 Nasal congestion: Secondary | ICD-10-CM

## 2023-11-30 DIAGNOSIS — J019 Acute sinusitis, unspecified: Secondary | ICD-10-CM | POA: Diagnosis not present

## 2023-11-30 DIAGNOSIS — J309 Allergic rhinitis, unspecified: Secondary | ICD-10-CM | POA: Diagnosis not present

## 2023-11-30 DIAGNOSIS — B9689 Other specified bacterial agents as the cause of diseases classified elsewhere: Secondary | ICD-10-CM

## 2023-11-30 LAB — POC COVID19 BINAXNOW: SARS Coronavirus 2 Ag: NEGATIVE

## 2023-11-30 LAB — POCT RAPID STREP A (OFFICE): Rapid Strep A Screen: NEGATIVE

## 2023-11-30 MED ORDER — AZELASTINE HCL 0.1 % NA SOLN
1.0000 | Freq: Two times a day (BID) | NASAL | 12 refills | Status: AC
  Filled 2023-11-30: qty 30, 30d supply, fill #0
  Filled 2024-04-19: qty 30, 30d supply, fill #1

## 2023-11-30 MED ORDER — LEVOCETIRIZINE DIHYDROCHLORIDE 5 MG PO TABS
5.0000 mg | ORAL_TABLET | Freq: Every evening | ORAL | 3 refills | Status: AC
Start: 2023-11-30 — End: ?
  Filled 2023-11-30: qty 30, 30d supply, fill #0
  Filled 2024-04-19: qty 30, 30d supply, fill #1

## 2023-11-30 MED ORDER — FLUTICASONE PROPIONATE 50 MCG/ACT NA SUSP
2.0000 | Freq: Every day | NASAL | 6 refills | Status: AC
  Filled 2023-11-30: qty 16, 30d supply, fill #0
  Filled 2024-04-19: qty 16, 30d supply, fill #1

## 2023-11-30 NOTE — Patient Instructions (Signed)
 COVID and Strep negative Likely allergies or a virus Recommend starting Xyzal or other antihistamine daily Adding Flonase and Astelin nose spray Continue Mucinex as needed Stay hydrated Follow-up if not improving

## 2023-11-30 NOTE — Progress Notes (Signed)
 Acute Office Visit  Subjective:     Patient ID: Madison Charles, female    DOB: 1991-03-04, 33 y.o.   MRN: [id number]  Chief Complaint  Patient presents with   Sore Throat     Patient is in today for sore throat, congestion.  Discussed the use of AI scribe software for clinical note transcription with the patient, who gave verbal consent to proceed.  History of Present Illness Madison Charles is a 33 year old female who presents with congestion and sore throat for three days.  She has been experiencing congestion and a sore throat for the past three days. The sore throat initially presented as a 'sore itchy throat' and reoccurred last night. The congestion was worse earlier today but has since improved slightly. The congestion is primarily in her nose, and she describes her throat as more 'itchy scratchy' rather than painful or swollen.  No high fevers have been noted. She denies being around anyone who is sick. She has been sneezing, particularly when exposed to pollen at work, which exacerbates her symptoms. Her nose is runny, but she denies any itching, coughing, wheezing, or trouble breathing. Her eyes are not itchy, watery, or burning.  She has been taking Mucinex at night and Sudafed, along with an over-the-counter allergy pill, which she cannot recall the name of. She notes that the Mucinex has helped loosen the mucus, making it easier to manage, especially at night when her nose tends to run.        ROS All review of systems negative except what is listed in the HPI      Objective:    BP 136/84   Pulse 82   Temp 98 F (36.7 C) (Oral)   Ht 5\' 5"  (1.651 m)   Wt 244 lb (110.7 kg)   SpO2 98%   BMI 40.60 kg/m    Physical Exam Vitals reviewed.  Constitutional:      General: She is not in acute distress.    Appearance: Normal appearance. She is obese. She is not ill-appearing.  HENT:     Head: Normocephalic and atraumatic.     Right Ear: Tympanic  membrane normal.     Left Ear: Tympanic membrane normal.     Nose: Congestion and rhinorrhea present.     Right Turbinates: Enlarged.     Left Turbinates: Enlarged.     Mouth/Throat:     Mouth: Mucous membranes are moist.     Pharynx: Oropharynx is clear. No posterior oropharyngeal erythema.  Eyes:     Conjunctiva/sclera: Conjunctivae normal.  Cardiovascular:     Rate and Rhythm: Normal rate and regular rhythm.     Heart sounds: Normal heart sounds.  Pulmonary:     Effort: Pulmonary effort is normal.     Breath sounds: Normal breath sounds. No wheezing, rhonchi or rales.  Musculoskeletal:     Cervical back: Normal range of motion and neck supple.  Lymphadenopathy:     Cervical: No cervical adenopathy.  Skin:    General: Skin is warm and dry.  Neurological:     Mental Status: She is alert and oriented to person, place, and time.  Psychiatric:        Mood and Affect: Mood normal.        Behavior: Behavior normal.        Thought Content: Thought content normal.        Judgment: Judgment normal.     Results for orders placed or performed  in visit on 11/30/23  POC COVID-19 BinaxNow  Result Value Ref Range   SARS Coronavirus 2 Ag Negative Negative  POCT rapid strep A  Result Value Ref Range   Rapid Strep A Screen Negative Negative        Assessment & Plan:   Problem List Items Addressed This Visit       Active Problems   Allergic rhinitis - Primary   Relevant Medications   levocetirizine (XYZAL) 5 MG tablet   fluticasone (FLONASE) 50 MCG/ACT nasal spray   azelastine (ASTELIN) 0.1 % nasal spray   Other Visit Diagnoses       Nasal congestion       Relevant Orders   POC COVID-19 BinaxNow (Completed)   POCT rapid strep A (Completed)     Sore throat       Relevant Orders   POC COVID-19 BinaxNow (Completed)   POCT rapid strep A (Completed)     Acute bacterial rhinosinusitis       Relevant Medications   levocetirizine (XYZAL) 5 MG tablet   fluticasone (FLONASE)  50 MCG/ACT nasal spray   azelastine (ASTELIN) 0.1 % nasal spray      COVID and Strep negative Likely allergies or a virus Recommend starting Xyzal or other antihistamine daily Adding Flonase and Astelin nose spray Continue Mucinex as needed Stay hydrated Follow-up if not improving      Meds ordered this encounter  Medications   levocetirizine (XYZAL) 5 MG tablet    Sig: Take 1 tablet (5 mg total) by mouth every evening.    Dispense:  30 tablet    Refill:  3    Supervising Provider:   Danise Edge A [[id number]]   fluticasone (FLONASE) 50 MCG/ACT nasal spray    Sig: Place 2 sprays into each nostril daily.    Dispense:  16 g    Refill:  6    Supervising Provider:   Danise Edge A [[id number]]   azelastine (ASTELIN) 0.1 % nasal spray    Sig: Place 1 spray into each nostril 2 (two) times daily as directed.    Dispense:  30 mL    Refill:  12    Supervising Provider:   Danise Edge A [[id number]]    Return if symptoms worsen or fail to improve.  Clayborne Dana, NP

## 2023-12-05 ENCOUNTER — Other Ambulatory Visit (HOSPITAL_COMMUNITY)
Admission: RE | Admit: 2023-12-05 | Discharge: 2023-12-05 | Disposition: A | Discharge status: Home / Self Care | Source: Ambulatory Visit | Attending: Obstetrics and Gynecology | Admitting: Obstetrics and Gynecology

## 2023-12-05 ENCOUNTER — Ambulatory Visit: Admitting: Obstetrics and Gynecology

## 2023-12-05 ENCOUNTER — Encounter: Payer: Self-pay | Admitting: Obstetrics and Gynecology

## 2023-12-05 VITALS — BP 124/67 | HR 59 | Ht 65.0 in | Wt 243.0 lb

## 2023-12-05 DIAGNOSIS — Z01419 Encounter for gynecological examination (general) (routine) without abnormal findings: Secondary | ICD-10-CM | POA: Insufficient documentation

## 2023-12-05 DIAGNOSIS — Z1339 Encounter for screening examination for other mental health and behavioral disorders: Secondary | ICD-10-CM | POA: Diagnosis not present

## 2023-12-05 DIAGNOSIS — Z124 Encounter for screening for malignant neoplasm of cervix: Secondary | ICD-10-CM | POA: Diagnosis not present

## 2023-12-05 NOTE — Progress Notes (Signed)
 ANNUAL EXAM Patient name: Madison Charles MRN [id number]  Date of birth: 06/12/1991 Chief Complaint:   No chief complaint on file.  History of Present Illness:   Madison Charles is a 33 y.o. G68P0010  female being seen today for a routine annual exam.  Current complaints:annual exam, no currently on birth control. Not preventing pregnancy.  Hx ruptured ectopic right tube removed. Following with primary care, visit this past week   Patient's last menstrual period was 10/29/2023 (exact date).   The pregnancy intention screening data noted above was reviewed. Potential methods of contraception were discussed. The patient elected to proceed with No data recorded.   Last pap 2021. Results were:  NILM . H/O abnormal pap: no Last mammogram: n/a. Results were: N/A. Family h/o breast cancer: no      12/05/2023    2:04 PM 09/06/2023    1:46 PM  Depression screen PHQ 2/9  Decreased Interest 0 2  Down, Depressed, Hopeless 0 0  PHQ - 2 Score 0 2  Altered sleeping 2 2  Tired, decreased energy 1 2  Change in appetite 1 1  Feeling bad or failure about yourself  0 0  Trouble concentrating 0 1  Moving slowly or fidgety/restless 0 0  Suicidal thoughts 0 0  PHQ-9 Score 4 8  Difficult doing work/chores  Somewhat difficult        12/05/2023    2:04 PM 09/06/2023    1:47 PM  GAD 7 : Generalized Anxiety Score  Nervous, Anxious, on Edge 0 1  Control/stop worrying 0 0  Worry too much - different things 0 0  Trouble relaxing 0 1  Restless 0 1  Easily annoyed or irritable 1 3  Afraid - awful might happen 0 0  Total GAD 7 Score 1 6  Anxiety Difficulty  Somewhat difficult     Review of Systems:   Pertinent items are noted in HPI Denies any headaches, blurred vision, fatigue, shortness of breath, chest pain, abdominal pain, abnormal vaginal discharge/itching/odor/irritation, problems with periods, bowel movements, urination, or intercourse unless otherwise stated above. Pertinent  History Reviewed:  Reviewed past medical,surgical, social and family history.  Reviewed problem list, medications and allergies. Physical Assessment:   Vitals:   12/05/23 1357  BP: 124/67  Pulse: (!) 59  Weight: 243 lb (110.2 kg)  Height: 5\' 5"  (1.651 m)  Body mass index is 40.44 kg/m.        Physical Examination:   General appearance - well appearing, and in no distress  Mental status - alert, oriented   Psych:  She has a normal mood and affect  Skin - warm and dry, normal color, no suspicious lesions noted  Chest - effort normal, all lung fields clear to auscultation bilaterally  Heart - normal rate and regular rhythm  Neck:  midline trachea, no thyromegaly or nodules  Breasts - breasts appear normal, no suspicious masses, no skin or nipple changes or  axillary nodes  Abdomen - soft, nontender, nondistended, no masses or organomegaly  Pelvic - VULVA: normal appearing vulva with no masses, tenderness or lesions  VAGINA: normal appearing vagina with normal color and discharge, no lesions  CERVIX: normal appearing cervix without discharge or lesions, no CMT  Thin prep pap is done w HR HPV cotesting  UTERUS: uterus is felt to be normal size, shape, consistency and nontender   ADNEXA: No adnexal masses or tenderness noted.   Extremities:  No swelling or varicosities noted  Chaperone present  for exam  No results found for this or any previous visit (from the past 24 hours).  Assessment & Plan:  1. Well woman exam with routine gynecological exam (Primary) Pap collected today Encouraged daily PNV  - Cytology - PAP( [place])  2. Cervical cancer screening    Labs/procedures today:   Mammogram: @ 33yo, or sooner if problems   No orders of the defined types were placed in this encounter.   Meds: No orders of the defined types were placed in this encounter.   Follow-up: Return in about 1 year (around 12/04/2024) for Zoanne Hinders, FNP

## 2023-12-08 LAB — CYTOLOGY - PAP
Comment: NEGATIVE
Diagnosis: NEGATIVE
High risk HPV: NEGATIVE

## 2023-12-16 ENCOUNTER — Other Ambulatory Visit: Payer: Self-pay

## 2023-12-16 ENCOUNTER — Ambulatory Visit
Admission: EM | Admit: 2023-12-16 | Discharge: 2023-12-16 | Disposition: A | Discharge status: Other Acute Inpatient Hospital | Attending: Family Medicine | Admitting: Family Medicine

## 2023-12-16 DIAGNOSIS — M25571 Pain in right ankle and joints of right foot: Secondary | ICD-10-CM

## 2023-12-16 DIAGNOSIS — S99911A Unspecified injury of right ankle, initial encounter: Secondary | ICD-10-CM

## 2023-12-16 MED ORDER — CELECOXIB 200 MG PO CAPS: 200.0000 mg | ORAL_CAPSULE | Freq: Every day | ORAL | 0 refills | Status: AC

## 2023-12-16 MED ORDER — OXYCODONE-ACETAMINOPHEN 5-325 MG PO TABS: 1.0000 | ORAL_TABLET | Freq: Four times a day (QID) | ORAL | 0 refills | Status: AC | PRN

## 2023-12-16 NOTE — Discharge Instructions (Addendum)
 Advised patient to take Celebrex daily to completion.  Advised patient may take Percocet daily or as needed for acute/severe right ankle pain.  Encouraged to increase daily water intake to 64 ounces per day when taking these medications.  Advised patient if symptoms worsen and/or unresolved please follow-up with [facility] orthopedics for further evaluation.  Contact information provided with this AVS.
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
## 2023-12-18 ENCOUNTER — Encounter: Payer: Self-pay | Admitting: Family Medicine

## 2023-12-18 ENCOUNTER — Ambulatory Visit: Admitting: Family Medicine

## 2023-12-18 ENCOUNTER — Other Ambulatory Visit (HOSPITAL_BASED_OUTPATIENT_CLINIC_OR_DEPARTMENT_OTHER): Payer: Self-pay

## 2023-12-18 DIAGNOSIS — N898 Other specified noninflammatory disorders of vagina: Secondary | ICD-10-CM

## 2023-12-18 DIAGNOSIS — M25571 Pain in right ankle and joints of right foot: Secondary | ICD-10-CM | POA: Diagnosis not present

## 2023-12-18 DIAGNOSIS — T148XXA Other injury of unspecified body region, initial encounter: Secondary | ICD-10-CM

## 2023-12-18 MED ORDER — FLUCONAZOLE 150 MG PO TABS
150.0000 mg | ORAL_TABLET | Freq: Every day | ORAL | 0 refills | Status: AC
  Filled 2023-12-18: qty 2, 2d supply, fill #0

## 2023-12-18 MED ORDER — METRONIDAZOLE 500 MG PO TABS
500.0000 mg | ORAL_TABLET | Freq: Two times a day (BID) | ORAL | 0 refills | Status: AC
  Filled 2023-12-18: qty 14, 7d supply, fill #0

## 2023-12-18 MED ORDER — MUPIROCIN 2 % EX OINT
1.0000 | TOPICAL_OINTMENT | Freq: Two times a day (BID) | CUTANEOUS | 1 refills | Status: AC
  Filled 2023-12-18: qty 22, 11d supply, fill #0
  Filled 2024-04-19: qty 22, 11d supply, fill #1
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
## 2023-12-26 ENCOUNTER — Ambulatory Visit (INDEPENDENT_AMBULATORY_CARE_PROVIDER_SITE_OTHER): Admitting: Sports Medicine

## 2023-12-26 ENCOUNTER — Encounter: Payer: Self-pay | Admitting: Sports Medicine

## 2023-12-26 ENCOUNTER — Ambulatory Visit (HOSPITAL_BASED_OUTPATIENT_CLINIC_OR_DEPARTMENT_OTHER)
Admission: RE | Admit: 2023-12-26 | Discharge: 2023-12-26 | Disposition: A | Discharge status: Home / Self Care | Source: Ambulatory Visit | Attending: Sports Medicine | Admitting: Sports Medicine

## 2023-12-26 ENCOUNTER — Ambulatory Visit: Admitting: Sports Medicine

## 2023-12-26 VITALS — BP 120/78 | Ht 65.0 in | Wt 243.0 lb

## 2023-12-26 DIAGNOSIS — S99911D Unspecified injury of right ankle, subsequent encounter: Secondary | ICD-10-CM

## 2023-12-26 DIAGNOSIS — M545 Low back pain, unspecified: Secondary | ICD-10-CM
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
## 2024-01-23 ENCOUNTER — Other Ambulatory Visit (HOSPITAL_BASED_OUTPATIENT_CLINIC_OR_DEPARTMENT_OTHER): Payer: Self-pay

## 2024-01-23 ENCOUNTER — Encounter: Payer: Self-pay | Admitting: Family Medicine

## 2024-01-23 DIAGNOSIS — M25571 Pain in right ankle and joints of right foot: Secondary | ICD-10-CM

## 2024-01-23 MED ORDER — MELOXICAM 15 MG PO TABS
15.0000 mg | ORAL_TABLET | Freq: Every day | ORAL | 0 refills | Status: AC
  Filled 2024-01-23: qty 30, 30d supply, fill #0

## 2024-04-30 ENCOUNTER — Telehealth
